# Patient Record
Sex: Female | Born: 1965 | Race: Black or African American | Hispanic: No | Marital: Single | Smoking: Never smoker
Health system: Southern US, Community
[De-identification: ages and names within clinical notes are randomized; demographics above are authoritative.]

## PROBLEM LIST (undated history)

## (undated) HISTORY — PX: OTHER SURGICAL HISTORY: SHX169

---

## 1999-06-06 ENCOUNTER — Emergency Department (HOSPITAL_COMMUNITY): Admission: EM | Admit: 1999-06-06 | Discharge: 1999-06-06 | Payer: Self-pay | Admitting: Emergency Medicine

## 1999-06-06 ENCOUNTER — Encounter: Payer: Self-pay | Admitting: Emergency Medicine

## 2003-03-24 ENCOUNTER — Other Ambulatory Visit: Admission: RE | Admit: 2003-03-24 | Discharge: 2003-03-24 | Payer: Self-pay | Admitting: Obstetrics and Gynecology

## 2004-06-09 ENCOUNTER — Encounter (INDEPENDENT_AMBULATORY_CARE_PROVIDER_SITE_OTHER): Payer: Self-pay | Admitting: *Deleted

## 2004-06-09 ENCOUNTER — Inpatient Hospital Stay (HOSPITAL_COMMUNITY): Admission: RE | Admit: 2004-06-09 | Discharge: 2004-06-12 | Payer: Self-pay | Admitting: Obstetrics and Gynecology

## 2007-08-05 ENCOUNTER — Other Ambulatory Visit: Admission: RE | Admit: 2007-08-05 | Discharge: 2007-08-05 | Payer: Self-pay | Admitting: Family Medicine

## 2010-07-15 ENCOUNTER — Encounter: Payer: Self-pay | Admitting: General Surgery

## 2010-11-10 NOTE — Op Note (Signed)
Erin Martin, HAUGHN            ACCOUNT NO.:  [id number]   MEDICAL RECORD NO.:  [id number]          PATIENT TYPE:  INP   LOCATION:  9146                          FACILITY:  [facility]   PHYSICIAN:  Lenoard Aden, M.D.DATE OF BIRTH:  06/10/1966   DATE OF PROCEDURE:  06/09/2004  DATE OF DISCHARGE:                                 OPERATIVE REPORT   PREOPERATIVE DIAGNOSES:  1.  Previous cesarean section x2, 39 weeks.  2.  Elective sterilization.   POSTOPERATIVE DIAGNOSES:  1.  Previous cesarean section x2, 39 weeks.  2.  Elective sterilization.   PROCEDURE:  Repeat low segment transverse cesarean section, left partial  salpingectomy.   SURGEON:  Lenoard Aden, M.D.   ASSISTANT:  Pershing Cox, M.D.   ANESTHESIA:  Spinal by Dr. Tacy Dura.   ESTIMATED BLOOD LOSS:  1000 mL.   COMPLICATIONS:  None.   DRAINS:  Foley.   COUNTS:  Correct.   The patient to recovery in good condition.   FINDINGS:  Full-term living female, occiput transverse position.  Apgars 8 and  9.  Anterior placenta.  Right tube surgically absent from previous ectopic  pregnancy.  Normal left tube.  Normal right tube and ovary.   BRIEF OPERATIVE NOTE:  After being apprised of the risks of anesthesia,  infection, bleeding, injury to abdominal organs with need for repair,  delayed versus immediate complications to include bowel and bladder injury,  possible failure risk of tubal ligation of 5-10 per 1000, the patient is  brought to the operating room where she is administered a spinal anesthetic  without complications and prepped and draped in the usual sterile fashion,  Foley catheter placed.  After achieving adequate anesthesia, dilute Marcaine  solution placed in the area of previous Pfannenstiel skin incision which was  made with the scalpel, carried down to the fascia which was nicked in the  midline, opened transversely using Mayo scissors.  The rectus muscles were  dissected sharply in the midline.   Peritoneum entered sharply with bladder  blade placed.  The uterus scored in a smiling fashion.  Atraumatic delivery  of a full-term living female with vacuum assistance, one pull, Apgars 8 and 9.  Pediatrician attends.  Cord blood collected.  Placenta delivered manually  from an anterior position intact.  Uterus secured using a dry lap pack and  closed in one layer using a 0 Monocryl suture.  Good hemostasis with  interrupted suture along the right lateral edge and in the midline with  another interrupted suture.  Right tube surgically absent. Left tube traced  out to the fimbriated end.  The ampullary isthmic portion of the left tube  is grasped, avascular portion of the mesosalpinx was cauterized creating a  window, and proximal distal plain ties are placed.  Tubal segment is excised  and sent to pathology for confirmation.  Good hemostasis noted.  Tubal  lumens are cauterized and visualized at this time.  Good hemostasis along  the incision is noted.  Bladder flap inspected and found to be hemostatic.  The uterus was replaced in the abdominal cavity.  Irrigation was  accomplished.  All blood clots are subsequently removed.  The fascia then  closed using a 0 Monocryl suture in a running fashion, skin closed using  staples.  The patient tolerated the procedure well, was transferred to  recovery in good condition.      RJT/MEDQ  D:  06/09/2004  T:  06/09/2004  Job:  [id number]

## 2010-11-10 NOTE — Discharge Summary (Signed)
Erin Martin, Erin Martin            ACCOUNT NO.:  [id number]   MEDICAL RECORD NO.:  [id number]          PATIENT TYPE:  OUT   LOCATION:  RAD                           FACILITY:  [facility]   PHYSICIAN:  Lenoard Aden, M.D.DATE OF BIRTH:  September 02, 1965   DATE OF ADMISSION:  DATE OF DISCHARGE:  06/12/2004                                 DISCHARGE SUMMARY   ADMISSION DIAGNOSIS:  Previous C-section x2 for repeat tubal ligation.   DISCHARGE DIAGNOSIS:  Same.   The patient underwent uncomplicated C-section and tubal ligation June 09, 2004.  Postoperative course uncomplicated.  Tolerated a regular diet  well.  Discharged home on day #3 with Tylox, prenatal vitamins given.  Follow up in the office in four to six weeks.     Rich   RJT/MEDQ  D:  07/11/2004  T:  07/11/2004  Job:  [id number]

## 2011-11-16 ENCOUNTER — Other Ambulatory Visit: Payer: Self-pay | Admitting: Obstetrics and Gynecology

## 2011-11-16 DIAGNOSIS — Z1231 Encounter for screening mammogram for malignant neoplasm of breast: Secondary | ICD-10-CM

## 2011-11-23 ENCOUNTER — Ambulatory Visit: Payer: Self-pay

## 2011-11-29 ENCOUNTER — Ambulatory Visit
Admission: RE | Admit: 2011-11-29 | Discharge: 2011-11-29 | Disposition: A | Discharge status: Home / Self Care | Payer: Self-pay | Source: Ambulatory Visit | Attending: Obstetrics and Gynecology | Admitting: Obstetrics and Gynecology

## 2011-11-29 DIAGNOSIS — Z1231 Encounter for screening mammogram for malignant neoplasm of breast: Secondary | ICD-10-CM

## 2013-06-25 IMAGING — MG MM DIGITAL SCREENING BILAT
6 series · 6 of 6 positions shown · non-contrast
Comparison: none

CLINICAL DATA: Screening.

MAMMOGRAPHIC BILATERAL DIGITAL SCREENING WITH CAD

[R CC]
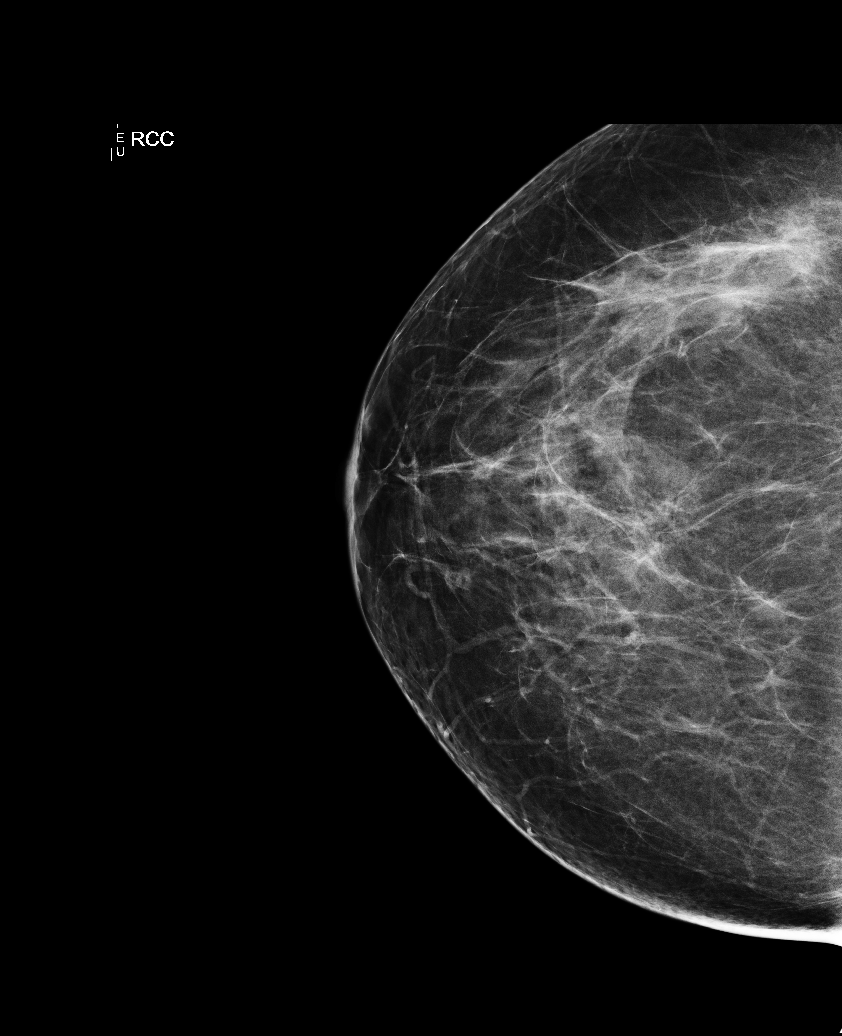

[L CC]
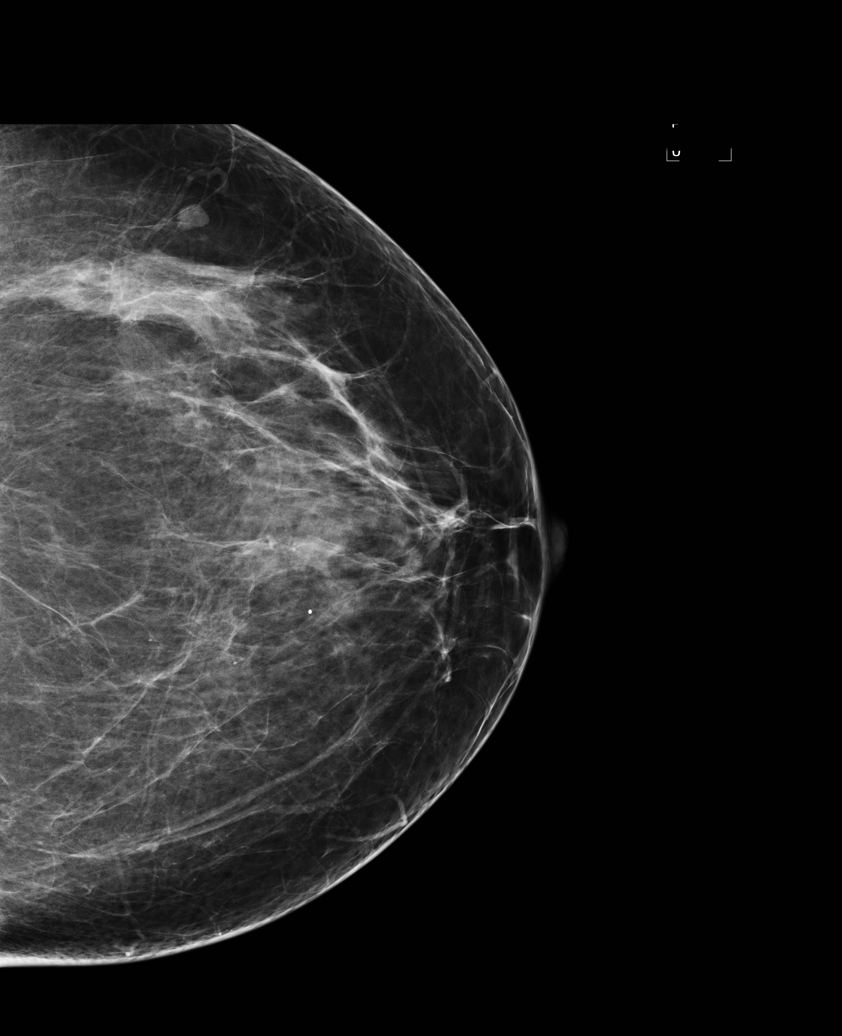

[L MLO (1 of 2)]
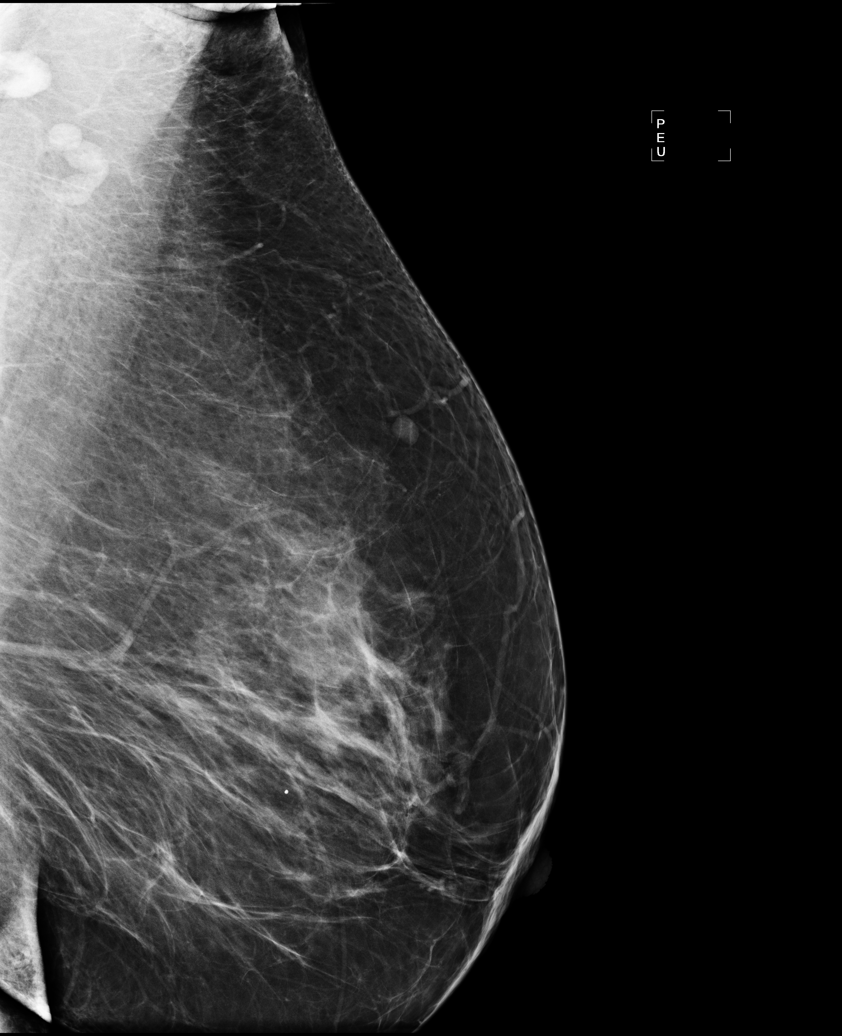

[R MLO (1 of 2)]
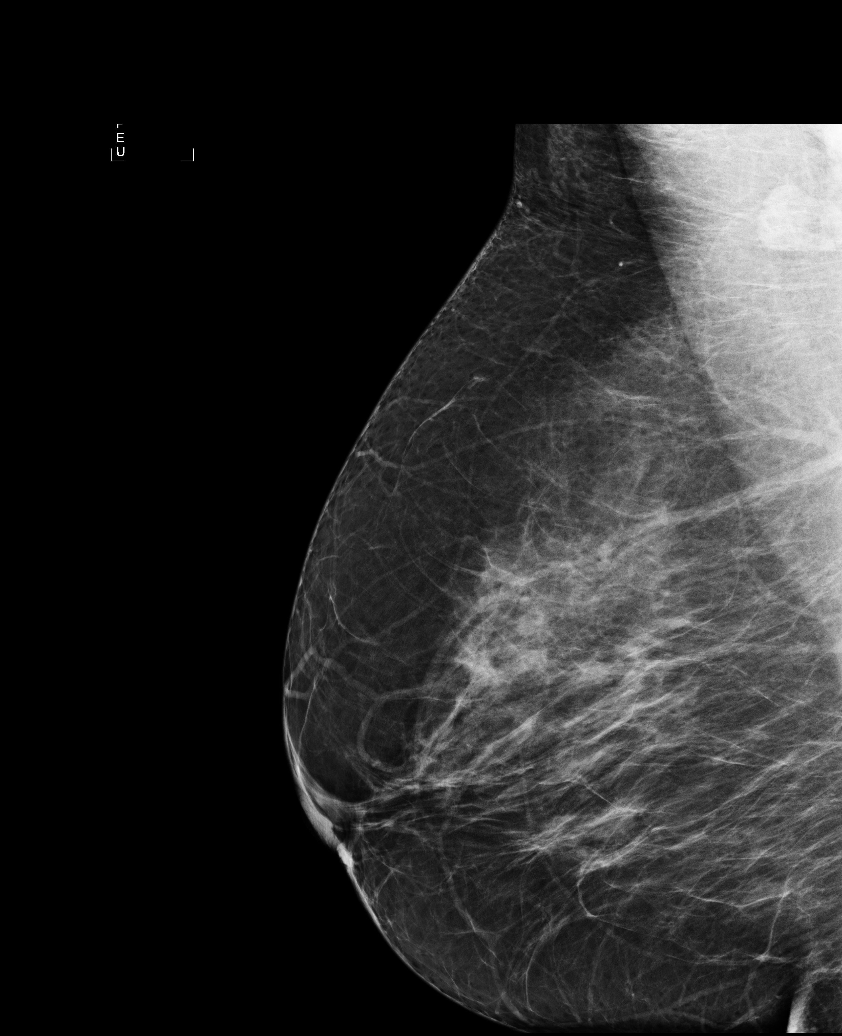

[R MLO (2 of 2)]
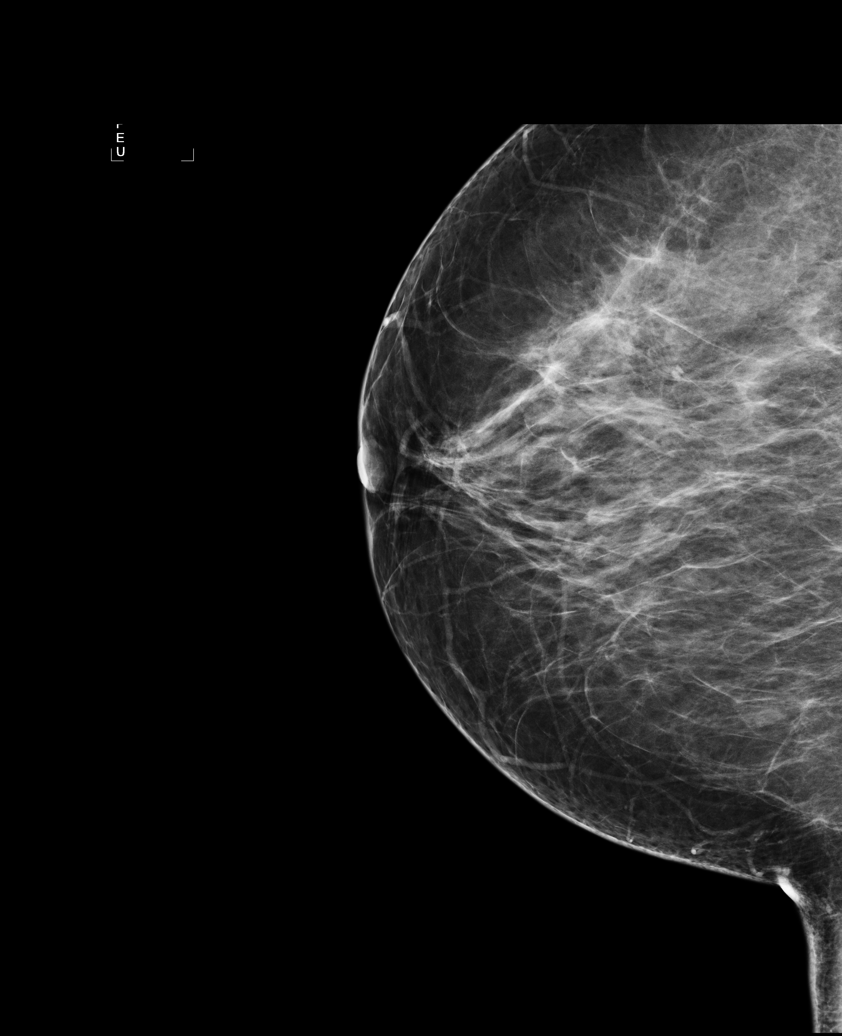

[L MLO (2 of 2)]
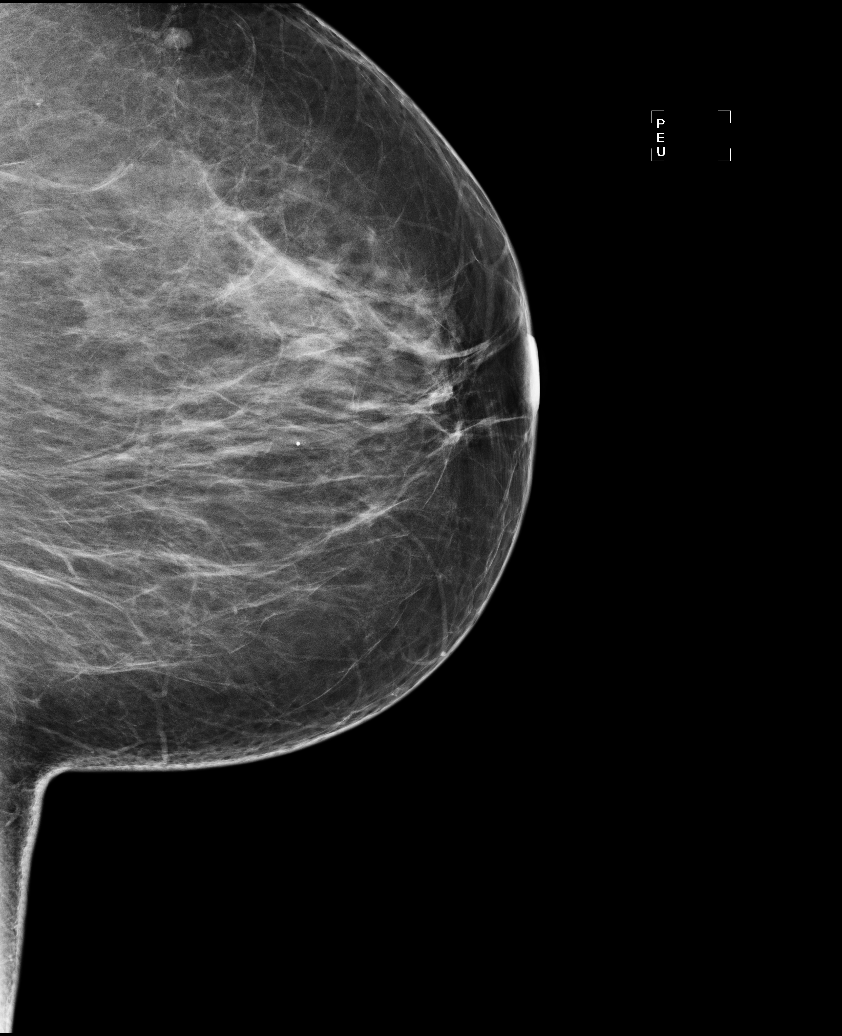

[6 of 6 positions shown; findings below may reference images not displayed]

FINDINGS: There are scattered fibroglandular densities.  No masses
or malignant type calcifications are identified.

Images were processed with CAD.
IMPRESSION: No specific mammographic evidence of malignancy.

A result letter of this screening mammogram will be mailed directly
to the patient.

RECOMMENDATION:
Screening mammogram in one year. (Code:[id number])

BI-RADS CATEGORY 1:  Negative

## 2015-02-28 ENCOUNTER — Emergency Department (HOSPITAL_COMMUNITY)
Admission: EM | Admit: 2015-02-28 | Discharge: 2015-02-28 | Discharge status: Absent without leave | Payer: Commercial Indemnity | Source: Home / Self Care

## 2015-02-28 ENCOUNTER — Emergency Department (HOSPITAL_COMMUNITY)
Admission: EM | Admit: 2015-02-28 | Discharge: 2015-02-28 | Disposition: A | Discharge status: Home / Self Care | Payer: Commercial Indemnity | Attending: Emergency Medicine | Admitting: Emergency Medicine

## 2015-02-28 ENCOUNTER — Encounter (HOSPITAL_COMMUNITY): Payer: Self-pay | Admitting: *Deleted

## 2015-02-28 DIAGNOSIS — R350 Frequency of micturition: Secondary | ICD-10-CM | POA: Insufficient documentation

## 2015-02-28 DIAGNOSIS — Z791 Long term (current) use of non-steroidal anti-inflammatories (NSAID): Secondary | ICD-10-CM | POA: Diagnosis not present

## 2015-02-28 DIAGNOSIS — R109 Unspecified abdominal pain: Secondary | ICD-10-CM | POA: Diagnosis present

## 2015-02-28 DIAGNOSIS — R1110 Vomiting, unspecified: Secondary | ICD-10-CM | POA: Insufficient documentation

## 2015-02-28 DIAGNOSIS — Z3202 Encounter for pregnancy test, result negative: Secondary | ICD-10-CM | POA: Insufficient documentation

## 2015-02-28 LAB — URINALYSIS, ROUTINE W REFLEX MICROSCOPIC
Bilirubin Urine: NEGATIVE
Glucose, UA: NEGATIVE mg/dL
Hgb urine dipstick: NEGATIVE
Ketones, ur: NEGATIVE mg/dL
Leukocytes, UA: NEGATIVE
Nitrite: NEGATIVE
Protein, ur: NEGATIVE mg/dL
Specific Gravity, Urine: 1.026 (ref 1.005–1.030)
Urobilinogen, UA: 2 mg/dL — ABNORMAL HIGH (ref 0.0–1.0)
pH: 7 (ref 5.0–8.0)

## 2015-02-28 LAB — POC URINE PREG, ED: Preg Test, Ur: NEGATIVE

## 2015-02-28 NOTE — Discharge Instructions (Signed)

## 2015-02-28 NOTE — ED Provider Notes (Signed)
CSN: [id number]     Arrival date & time 02/28/15  0105 History  This chart was scribed for Zadie Rhine, MD by Budd Palmer, ED Scribe. This patient was seen in room WA11/WA11 and the patient's care was started at 1:25 AM.    Chief Complaint  Patient presents with  . Flank Pain   Patient is a 49 y.o. female presenting with flank pain. The history is provided by the patient. No language interpreter was used.  Flank Pain This is a new problem. The current episode started more than 2 days ago. The problem has been gradually worsening. The symptoms are relieved by NSAIDs.   HPI Comments: Erin Martin is a 49 y.o. female who presents to the Emergency Department complaining of gradual onset, sharp, intermittent, worsening left-sided flank pain onset 3 days ago. She notes the pain has begun to radiate to the left abdomen as of today. She reports associated vomiting (1x) and frequency. She has taken Aleve with effective pain relief, but states the pain returned much worse each time the medicine wore off. She denies a PMHx of kidney stones. Pt denies fever, dysuria, weakness, numbness, and vaginal bleeding.  PMH - none Past Surgical History  Procedure Laterality Date  . C-section     No family history on file. Social History  Substance Use Topics  . Smoking status: Never Smoker   . Smokeless tobacco: None  . Alcohol Use: Yes     Comment: rarely   OB History    No data available     Review of Systems  Constitutional: Negative for fever.  Gastrointestinal: Positive for vomiting.  Genitourinary: Positive for frequency and flank pain. Negative for dysuria and vaginal bleeding.  Neurological: Negative for weakness and numbness.  All other systems reviewed and are negative.   Allergies  Review of patient's allergies indicates no known allergies.  Home Medications   Prior to Admission medications   Medication Sig Start Date End Date Taking? Authorizing Provider  naproxen  sodium (ANAPROX) 220 MG tablet Take 220 mg by mouth 2 (two) times daily with a meal.   Yes Historical Provider, MD   BP 149/84 mmHg  Pulse 85  Temp(Src) 98.1 F (36.7 C) (Oral)  Resp 20  SpO2 100%  LMP 02/14/2015 Physical Exam CONSTITUTIONAL: Well developed/well nourished HEAD: Normocephalic/atraumatic EYES: EOMI/PERRL ENMT: Mucous membranes moist NECK: supple no meningeal signs SPINE/BACK:entire spine nontender CV: S1/S2 noted, no murmurs/rubs/gallops noted LUNGS: Lungs are clear to auscultation bilaterally, no apparent distress ABDOMEN: soft, nontender, no rebound or guarding, bowel sounds noted throughout abdomen WU:JWJX cva tenderness NEURO: Pt is awake/alert/appropriate, moves all extremitiesx4.  No facial droop.   EXTREMITIES: pulses normal/equal, full ROM SKIN: warm, color normal PSYCH: no abnormalities of mood noted, alert and oriented to situation  ED Course  Procedures  DIAGNOSTIC STUDIES: Oxygen Saturation is 100% on RA, normal by my interpretation.    COORDINATION OF CARE: 1:29 AM - Discussed probable kidney stone. Discussed plans to order urinalysis. Pt advised of plan for treatment and pt agrees.   Pt declined pain meds She reports feeling improved Most of her pain was in left flank and resolving She would like to go home and defer further testing I feel this is reasonable.   Currently she has no signs of acute abdominal emergency We discussed strict ER return precautions  Labs Review Labs Reviewed  URINALYSIS, ROUTINE W REFLEX MICROSCOPIC (NOT AT [facility]) - Abnormal; Notable for the following:    APPearance CLOUDY (*)  Urobilinogen, UA 2.0 (*)    All other components within normal limits  POC URINE PREG, ED      MDM   Final diagnoses:  Left flank pain    Nursing notes including past medical history and social history reviewed and considered in documentation Labs/vital reviewed myself and considered during evaluation    I personally  performed the services described in this documentation, which was scribed in my presence. The recorded information has been reviewed and is accurate.      Zadie Rhine, MD 02/28/15 [phone]

## 2015-02-28 NOTE — ED Notes (Signed)
Pt states that she was having left sided flank pain that began on Fri; pt states that the pain is moving through her side toward her lower abd; pt reports vomiting x 1; pt reports urinary frequency and urgency

## 2019-10-14 ENCOUNTER — Ambulatory Visit: Payer: Managed Care, Other (non HMO) | Admitting: Family Medicine

## 2019-10-14 DIAGNOSIS — Z0289 Encounter for other administrative examinations: Secondary | ICD-10-CM

## 2019-10-14 NOTE — Progress Notes (Deleted)
Erin Martin is a 54 y.o. female  No chief complaint on file.   HPI: Erin Martin is a 54 y.o. female here as a new patient to establish care and for annual CPE, PAP.  Last PAP: *** Last mammo: *** Last colonoscopy: ***  Diet/Exercise: ***  Med refills needed today? ***  Last menstrual cycle: ***  No past medical history on file.  Past Surgical History:  Procedure Laterality Date  . c-section      Social History   Socioeconomic History  . Marital status: Single    Spouse name: Not on file  . Number of children: Not on file  . Years of education: Not on file  . Highest education level: Not on file  Occupational History  . Not on file  Tobacco Use  . Smoking status: Never Smoker  Substance and Sexual Activity  . Alcohol use: Yes    Comment: rarely  . Drug use: No  . Sexual activity: Not on file  Other Topics Concern  . Not on file  Social History Narrative  . Not on file   Social Determinants of Health   Financial Resource Strain:   . Difficulty of Paying Living Expenses:   Food Insecurity:   . Worried About Charity fundraiser in the Last Year:   . Arboriculturist in the Last Year:   Transportation Needs:   . Film/video editor (Medical):   Marland Kitchen Lack of Transportation (Non-Medical):   Physical Activity:   . Days of Exercise per Week:   . Minutes of Exercise per Session:   Stress:   . Feeling of Stress :   Social Connections:   . Frequency of Communication with Friends and Family:   . Frequency of Social Gatherings with Friends and Family:   . Attends Religious Services:   . Active Member of Clubs or Organizations:   . Attends Archivist Meetings:   Marland Kitchen Marital Status:   Intimate Partner Violence:   . Fear of Current or Ex-Partner:   . Emotionally Abused:   Marland Kitchen Physically Abused:   . Sexually Abused:     No family history on file.    There is no immunization history on file for this patient.  Outpatient Encounter  Medications as of 10/14/2019  Medication Sig  . naproxen sodium (ANAPROX) 220 MG tablet Take 220 mg by mouth 2 (two) times daily with a meal.   No facility-administered encounter medications on file as of 10/14/2019.     ROS: Gen: no fever, chills  Skin: no rash, itching ENT: no ear pain, ear drainage, nasal congestion, rhinorrhea, sinus pressure, sore throat Eyes: no blurry vision, double vision Resp: no cough, wheeze,SOB Breast: no breast tenderness, no nipple discharge, no breast masses CV: no CP, palpitations, LE edema,  GI: no heartburn, n/v/d/c, abd pain GU: no dysuria, urgency, frequency, hematuria; no vaginal itching, odor, discharge MSK: no joint pain, myalgias, back pain Neuro: no dizziness, headache, weakness, vertigo Psych: no depression, anxiety, insomnia   No Known Allergies  There were no vitals taken for this visit.  Physical Exam  Constitutional: She is oriented to person, place, and time. She appears well-developed and well-nourished. No distress.  HENT:  Head: Normocephalic and atraumatic.  Right Ear: Tympanic membrane and ear canal normal.  Left Ear: Tympanic membrane and ear canal normal.  Nose: Nose normal.  Mouth/Throat: Oropharynx is clear and moist and mucous membranes are normal.  Eyes: Pupils are equal,  round, and reactive to light. Conjunctivae are normal.  Neck: No thyromegaly present.  Cardiovascular: Normal rate, regular rhythm, normal heart sounds and intact distal pulses.  No murmur heard. Pulmonary/Chest: Effort normal and breath sounds normal. No respiratory distress. She has no wheezes. She has no rhonchi.  Abdominal: Soft. Bowel sounds are normal. She exhibits no distension and no mass. There is no abdominal tenderness.  Genitourinary:    Vagina and uterus normal.  There is no rash, tenderness or lesion on the right labia. There is no rash, tenderness or lesion on the left labia. Cervix exhibits no motion tenderness, no discharge and no  friability. Right adnexum displays no mass, no tenderness and no fullness. Left adnexum displays no mass, no tenderness and no fullness.  Musculoskeletal:        General: No edema.     Cervical back: Neck supple.  Lymphadenopathy:    She has no cervical adenopathy.  Neurological: She is alert and oriented to person, place, and time. She exhibits normal muscle tone. Coordination normal.  Skin: Skin is warm and dry.  Psychiatric: She has a normal mood and affect. Her behavior is normal.     A/P:    This visit occurred during the SARS-CoV-2 public health emergency.  Safety protocols were in place, including screening questions prior to the visit, additional usage of staff PPE, and extensive cleaning of exam room while observing appropriate contact time as indicated for disinfecting solutions.

## 2022-08-08 ENCOUNTER — Encounter: Payer: Self-pay | Admitting: Family Medicine

## 2022-08-08 ENCOUNTER — Ambulatory Visit: Payer: Managed Care, Other (non HMO) | Admitting: Family Medicine

## 2022-08-08 ENCOUNTER — Other Ambulatory Visit (HOSPITAL_COMMUNITY)
Admission: RE | Admit: 2022-08-08 | Discharge: 2022-08-08 | Disposition: A | Discharge status: Home / Self Care | Payer: Managed Care, Other (non HMO) | Source: Ambulatory Visit | Attending: Family Medicine | Admitting: Family Medicine

## 2022-08-08 DIAGNOSIS — Z1329 Encounter for screening for other suspected endocrine disorder: Secondary | ICD-10-CM

## 2022-08-08 DIAGNOSIS — Z1211 Encounter for screening for malignant neoplasm of colon: Secondary | ICD-10-CM | POA: Insufficient documentation

## 2022-08-08 DIAGNOSIS — E7800 Pure hypercholesterolemia, unspecified: Secondary | ICD-10-CM | POA: Diagnosis not present

## 2022-08-08 DIAGNOSIS — Z113 Encounter for screening for infections with a predominantly sexual mode of transmission: Secondary | ICD-10-CM | POA: Insufficient documentation

## 2022-08-08 DIAGNOSIS — Z131 Encounter for screening for diabetes mellitus: Secondary | ICD-10-CM

## 2022-08-08 DIAGNOSIS — Z7409 Other reduced mobility: Secondary | ICD-10-CM

## 2022-08-08 DIAGNOSIS — R030 Elevated blood-pressure reading, without diagnosis of hypertension: Secondary | ICD-10-CM

## 2022-08-08 DIAGNOSIS — Z1159 Encounter for screening for other viral diseases: Secondary | ICD-10-CM | POA: Insufficient documentation

## 2022-08-08 DIAGNOSIS — T2005XA Burn of unspecified degree of scalp [any part], initial encounter: Secondary | ICD-10-CM | POA: Insufficient documentation

## 2022-08-08 DIAGNOSIS — Z1321 Encounter for screening for nutritional disorder: Secondary | ICD-10-CM | POA: Insufficient documentation

## 2022-08-08 DIAGNOSIS — T2005XS Burn of unspecified degree of scalp [any part], sequela: Secondary | ICD-10-CM

## 2022-08-08 DIAGNOSIS — Z114 Encounter for screening for human immunodeficiency virus [HIV]: Secondary | ICD-10-CM | POA: Insufficient documentation

## 2022-08-08 DIAGNOSIS — D508 Other iron deficiency anemias: Secondary | ICD-10-CM | POA: Insufficient documentation

## 2022-08-08 MED ORDER — SILVER SULFADIAZINE 1 % EX CREA: 1.0000 | TOPICAL_CREAM | Freq: Every day | CUTANEOUS | 0 refills | Status: DC

## 2022-08-08 MED ORDER — TIRZEPATIDE-WEIGHT MANAGEMENT 2.5 MG/0.5ML ~~LOC~~ SOAJ: 2.5000 mg | SUBCUTANEOUS | 0 refills | Status: DC

## 2022-08-08 NOTE — Assessment & Plan Note (Signed)
Denies current symptoms- request for STI screening Is sexually active with female partner(s)
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
## 2022-08-08 NOTE — Assessment & Plan Note (Signed)
Concern for weight impairing exercise and comfort when sitting and standing as well as walking

## 2022-08-08 NOTE — Assessment & Plan Note (Signed)
Recommend Vit B12 and Vit D given concern for stressors and weight mgmt  Body mass index is 44.59 kg/m.

## 2022-08-08 NOTE — Assessment & Plan Note (Signed)
Agreeable to colo guard; notes that she will have difficulty swallowing amount of solution needed for traditional

## 2022-08-08 NOTE — Assessment & Plan Note (Signed)
Low risk screen Treatable, and curable. If left untreated Hep C can lead to cirrhosis and liver failure. Encourage routine testing; recommend repeat testing if risk factors change.  

## 2022-08-08 NOTE — Assessment & Plan Note (Signed)
Recommend A1c given obesity, borderline BP and hx of LDL elevation

## 2022-08-08 NOTE — Assessment & Plan Note (Signed)
Hx of IDA- recommend repeat CBC and iron panel

## 2022-08-08 NOTE — Assessment & Plan Note (Signed)
Recommend screening for thyroid given borderline BP; weight Body mass index is 44.59 kg/m.

## 2022-08-08 NOTE — Assessment & Plan Note (Signed)
Historically elevated Repeat NFLP recommend diet low in saturated fat and regular exercise - 30 min at least 5 times per week
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
## 2022-08-08 NOTE — Assessment & Plan Note (Signed)
Acute, stable Endorses headaches- however, has been under stress at home and at work. Also, does not eat regular meals or drink sufficient water. Goal <140/<90 without co morbid conditions

## 2022-08-08 NOTE — Assessment & Plan Note (Signed)
Low risk screen ?Consented; encouraged to "know your status" ?Recommend repeat screen if risk factors change ? ?

## 2022-08-08 NOTE — Assessment & Plan Note (Signed)
Chronic, worsening per pt report Body mass index is 44.59 kg/m. Continue to recommend balanced, lower carb meals. Smaller meal size, adding snacks. Choosing water as drink of choice and increasing purposeful exercise.

## 2022-08-09 ENCOUNTER — Telehealth: Payer: Self-pay

## 2022-08-09 ENCOUNTER — Other Ambulatory Visit: Payer: Self-pay | Admitting: Family Medicine

## 2022-08-09 LAB — CBC WITH DIFFERENTIAL/PLATELET
Basos: 1 %
Immature Grans (Abs): 0 10*3/uL (ref 0.0–0.1)
Lymphocytes Absolute: 1.9 10*3/uL (ref 0.7–3.1)
MCHC: 31.8 g/dL (ref 31.5–35.7)
MCV: 87 fL (ref 79–97)
Neutrophils Absolute: 2.2 10*3/uL (ref 1.4–7.0)
Neutrophils: 47 %
Platelets: 214 10*3/uL (ref 150–450)
RBC: 4.25 x10E6/uL (ref 3.77–5.28)
WBC: 4.8 10*3/uL (ref 3.4–10.8)

## 2022-08-09 LAB — COMPREHENSIVE METABOLIC PANEL

## 2022-08-09 LAB — IRON,TIBC AND FERRITIN PANEL

## 2022-08-09 LAB — LIPID PANEL

## 2022-08-09 LAB — HEPATITIS C ANTIBODY: Hep C Virus Ab: NONREACTIVE

## 2022-08-09 LAB — HIV ANTIBODY (ROUTINE TESTING W REFLEX): HIV Screen 4th Generation wRfx: NONREACTIVE

## 2022-08-09 LAB — B12 AND FOLATE PANEL: Vitamin B-12: 597 pg/mL (ref 232–1245)

## 2022-08-09 MED ORDER — VITAMIN D (ERGOCALCIFEROL) 1.25 MG (50000 UNIT) PO CAPS: 50000.0000 [IU] | ORAL_CAPSULE | ORAL | 0 refills | Status: DC

## 2022-08-09 NOTE — Progress Notes (Signed)
It was nice to meet you yesterday Labs have been reviewed -Vit D is low. Recommend weekly supplement to assist. -Normal B12 and folate. -Negative HIV, Hep C, Syphilis  Other labs are pending.Marland KitchenMarland Kitchen

## 2022-08-09 NOTE — Telephone Encounter (Signed)
Copied from [place]. Topic: General - Other >> Aug 09, 2022 11:05 AM Sabas Sous wrote: Reason for CRM: Cytology called to report that a urine specimen has been submitted for HSV and HPV and they cannot use urine for those labs.  Janine  Best contact: [phone]

## 2022-08-10 LAB — CERVICOVAGINAL ANCILLARY ONLY
Bacterial Vaginitis-Urine: NEGATIVE
Candida Urine: NEGATIVE — AB
Candida Urine: POSITIVE — AB
Chlamydia: NEGATIVE
Comment: NEGATIVE
Comment: NEGATIVE
Comment: NORMAL
Neisseria Gonorrhea: NEGATIVE
Trichomonas: NEGATIVE

## 2022-08-10 NOTE — Progress Notes (Signed)
Normal STI screening; yeast noted in urine. Not treated given recommendations from UpToDate unless pt is symptomatic.

## 2022-08-11 LAB — CBC WITH DIFFERENTIAL/PLATELET
Basophils Absolute: 0 10*3/uL (ref 0.0–0.2)
EOS (ABSOLUTE): 0.2 10*3/uL (ref 0.0–0.4)
Eos: 5 %
Hematocrit: 37.1 % (ref 34.0–46.6)
Hemoglobin: 11.8 g/dL (ref 11.1–15.9)
Immature Granulocytes: 0 %
Lymphs: 40 %
MCH: 27.8 pg (ref 26.6–33.0)
Monocytes Absolute: 0.3 10*3/uL (ref 0.1–0.9)
Monocytes: 7 %
RDW: 11.9 % (ref 11.7–15.4)

## 2022-08-11 LAB — COMPREHENSIVE METABOLIC PANEL
Albumin/Globulin Ratio: 1 — ABNORMAL LOW (ref 1.2–2.2)
BUN/Creatinine Ratio: 22 (ref 9–23)
BUN: 15 mg/dL (ref 6–24)
Bilirubin Total: <0.2 mg/dL (ref 0.0–1.2)
CO2: 19 mmol/L — ABNORMAL LOW (ref 20–29)
Calcium: 9.4 mg/dL (ref 8.7–10.2)
Creatinine, Ser: 0.68 mg/dL (ref 0.57–1.00)
Globulin, Total: 4 g/dL (ref 1.5–4.5)
Glucose: 93 mg/dL (ref 70–99)

## 2022-08-11 LAB — B12 AND FOLATE PANEL: Folate: 13.7 ng/mL (ref >3.0)

## 2022-08-11 LAB — VITAMIN D 25 HYDROXY (VIT D DEFICIENCY, FRACTURES): Vit D, 25-Hydroxy: 6.4 ng/mL — ABNORMAL LOW (ref 30.0–100.0)

## 2022-08-11 LAB — HEMOGLOBIN A1C
Est. average glucose Bld gHb Est-mCnc: 103 mg/dL
Hgb A1c MFr Bld: 5.2 % (ref 4.8–5.6)

## 2022-08-11 LAB — LIPID PANEL: Chol/HDL Ratio: 4.3 ratio (ref 0.0–4.4)

## 2022-08-11 LAB — RPR: RPR Ser Ql: NONREACTIVE

## 2022-08-11 LAB — IRON,TIBC AND FERRITIN PANEL: Total Iron Binding Capacity: 374 ug/dL (ref 250–450)

## 2022-08-11 LAB — TSH+FREE T4
Free T4: 0.95 ng/dL (ref 0.82–1.77)
TSH: 0.91 u[IU]/mL (ref 0.450–4.500)

## 2022-08-12 NOTE — Progress Notes (Signed)
Chemistry shows borderline sodium elevation; likely elevated d/t dehydration. Cholesterol is elevated in total and bad/LDL. The 10-year ASCVD risk score (Arnett DK, et al., 2019) is: 5.1%. This is borderline- would recommend statin medication to assist. Please let us know if you are interested.    Values used to calculate the score:     Age: 57 years     Sex: Female     Is Non-Hispanic African American: Yes     Diabetic: No     Tobacco smoker: No     Systolic Blood Pressure: [id number] mmHg     Is BP treated: No     HDL Cholesterol: 59 mg/dL     Total Cholesterol: 252 mg/dL  I continue to recommend diet low in saturated fat and regular exercise - 30 min at least 5 times per week  Vit D is very low. Recommend 5000 IU OTC to assist vs weekly Rx supplement.  Normal cell count. No pre-diabetes. Normal thyroid. Normal iron.

## 2022-08-15 ENCOUNTER — Ambulatory Visit: Payer: Managed Care, Other (non HMO) | Admitting: Family Medicine

## 2022-08-28 ENCOUNTER — Other Ambulatory Visit (HOSPITAL_COMMUNITY)
Admission: RE | Admit: 2022-08-28 | Discharge: 2022-08-28 | Disposition: A | Discharge status: Home / Self Care | Payer: Managed Care, Other (non HMO) | Source: Ambulatory Visit | Attending: Family Medicine | Admitting: Family Medicine

## 2022-08-28 ENCOUNTER — Encounter: Payer: Self-pay | Admitting: Family Medicine

## 2022-08-28 ENCOUNTER — Ambulatory Visit: Payer: Managed Care, Other (non HMO) | Admitting: Family Medicine

## 2022-08-28 DIAGNOSIS — I10 Essential (primary) hypertension: Secondary | ICD-10-CM

## 2022-08-28 DIAGNOSIS — Z1231 Encounter for screening mammogram for malignant neoplasm of breast: Secondary | ICD-10-CM | POA: Diagnosis not present

## 2022-08-28 DIAGNOSIS — Z124 Encounter for screening for malignant neoplasm of cervix: Secondary | ICD-10-CM | POA: Diagnosis present

## 2022-08-28 MED ORDER — BUPROPION HCL ER (XL) 150 MG PO TB24: 150.0000 mg | ORAL_TABLET | Freq: Every day | ORAL | 1 refills | Status: DC

## 2022-08-28 MED ORDER — AMLODIPINE BESYLATE 5 MG PO TABS: 5.0000 mg | ORAL_TABLET | Freq: Every day | ORAL | 1 refills | Status: AC

## 2022-08-28 MED ORDER — NALTREXONE HCL 50 MG PO TABS: 25.0000 mg | ORAL_TABLET | Freq: Every day | ORAL | 0 refills | Status: DC

## 2022-08-28 NOTE — Assessment & Plan Note (Signed)
Start of Norvasc 5 mg daily Return in 4-6 weeks Goal <140/<90

## 2022-08-28 NOTE — Assessment & Plan Note (Signed)
Chronic, stable Body mass index is 44.59 kg/m. Start naltrexone and wellbutrin Continue to recommend balanced, lower carb meals. Smaller meal size, adding snacks. Choosing water as drink of choice and increasing purposeful exercise.

## 2022-08-28 NOTE — Assessment & Plan Note (Signed)
Negative PAP history

## 2022-08-28 NOTE — Assessment & Plan Note (Signed)
Due for screening for mammogram, denies breast concerns, provided with phone number to call and schedule appointment for mammogram. Encouraged to repeat breast cancer screening every 1-2 years.  Please call and schedule your mammogram:  [facility] at [facility]  [place], [place],  [place]  [address] Get Driving Directions Main: [phone]  Sunday:Closed Monday:7:20 AM - 5:00 PM Tuesday:7:20 AM - 5:00 PM Wednesday:7:20 AM - 5:00 PM Thursday:7:20 AM - 5:00 PM Friday:7:20 AM - 4:30 PM Saturday:Closed

## 2022-08-28 NOTE — Patient Instructions (Signed)
The CDC recommends two doses of Shingrix (the new shingles vaccine) separated by 2 to 6 months for adults age 56 years and older. I recommend checking with your insurance plan regarding coverage for this vaccine.    

## 2022-08-28 NOTE — Progress Notes (Signed)
.   Established patient visit  Patient: Erin Martin   DOB: 11-Jun-1966   57 y.o. Female  MRN: [id number] Visit Date: 08/28/2022  Today's healthcare provider: [name], FNP  Re Introduced to nurse practitioner role and practice setting.  All questions answered.  Discussed provider/patient relationship and expectations.  Subjective    HPI HPI   Lab results- PAP smear- obesity mgmt Last edited by Elta Guadeloupe, CMA on 08/28/2022  9:38 AM.      Obesity: Patient complains of obesity. Patient cites self-image as reasons for wanting to lose weight.  Current Exercise Habits none  Current Eating Habits Number of regular meals per day: 1 Number of snacking episodes per day: 4 Who shops for food? patient and daughter Who prepares food? patient and daughter Who eats with patient? patient and friend Binge behavior?: yes - excess quantity, chips, candy, chocolate; often goes out Purge behavior? no Anorexic behavior? no Eating precipitated by stress? no Guilt feelings associated with eating? yes - a little bit  Other Potential Contributing Factors Use of alcohol: none Use of medications that may cause weight gain none History of past abuse? none Psych History:  none Comorbidities: hypertension  Hypertension: Patient is here for evaluation of elevated blood pressures.  Age at onset of elevated blood pressure:  50s.Cardiac symptoms dyspnea and near-syncope. Patient denies chest pain, chest pressure/discomfort, claudication, exertional chest pressure/discomfort, fatigue, irregular heart beat, lower extremity edema, near-syncope, orthopnea, palpitations, paroxysmal nocturnal dyspnea, syncope, and tachypnea.  Cardiovascular risk factors: obesity (BMI >= 30 kg/m2) and sedentary lifestyle. Use of agents associated with hypertension: none. History of target organ damage: none.  Medications:  Outpatient Medications Prior to Visit  Medication Sig   naproxen sodium (ANAPROX) 220 MG tablet  Take 220 mg by mouth 2 (two) times daily with a meal.   silver sulfADIAZINE (SILVADENE) 1 % cream Apply 1 Application topically daily.   tirzepatide (ZEPBOUND) 2.5 MG/0.5ML Pen Inject 2.5 mg into the skin once a week.   Vitamin D, Ergocalciferol, (DRISDOL) 1.25 MG (50000 UNIT) CAPS capsule Take 1 capsule (50,000 Units total) by mouth every 7 (seven) days.   No facility-administered medications prior to visit.   Review of Systems    Objective    BP (!) 148/86 (BP Location: Right Arm, Patient Position: Sitting, Cuff Size: Large)   Pulse 70   Temp 98.9 F (37.2 C)   Ht '5\' 1"'$  (1.549 m)   Wt 236 lb (107 kg)   LMP 03/26/2022   SpO2 98%   BMI 44.59 kg/m   Physical Exam Vitals and nursing note reviewed.  Constitutional:      General: She is not in acute distress.    Appearance: Normal appearance. She is obese. She is not ill-appearing, toxic-appearing or diaphoretic.  HENT:     Head: Normocephalic and atraumatic.  Cardiovascular:     Rate and Rhythm: Normal rate and regular rhythm.     Pulses: Normal pulses.     Heart sounds: Normal heart sounds. No murmur heard.    No friction rub. No gallop.  Pulmonary:     Effort: Pulmonary effort is normal. No respiratory distress.     Breath sounds: Normal breath sounds. No stridor. No wheezing, rhonchi or rales.  Chest:     Chest wall: No tenderness.     Comments: Breasts: breasts appear normal, no suspicious masses, no skin or nipple changes or axillary nodes, right breast normal without mass, skin or nipple changes or axillary nodes,  left breast normal without mass, skin or nipple changes or axillary nodes, risk and benefit of breast self-exam was discussed  Abdominal:     General: Bowel sounds are normal.     Palpations: Abdomen is soft.  Genitourinary:    General: Normal vulva.     Exam position: Lithotomy position.     Tanner stage (genital): 5.     Vagina: Vaginal discharge present.     Cervix: Normal.     Uterus: Normal.       Adnexa: Right adnexa normal and left adnexa normal.  Musculoskeletal:        General: No swelling, tenderness, deformity or signs of injury. Normal range of motion.     Right lower leg: No edema.     Left lower leg: No edema.  Skin:    General: Skin is warm and dry.     Capillary Refill: Capillary refill takes less than 2 seconds.     Coloration: Skin is not jaundiced or pale.     Findings: No bruising, erythema, lesion or rash.  Neurological:     General: No focal deficit present.     Mental Status: She is alert and oriented to person, place, and time. Mental status is at baseline.     Cranial Nerves: No cranial nerve deficit.     Sensory: No sensory deficit.     Motor: No weakness.     Coordination: Coordination normal.  Psychiatric:        Mood and Affect: Mood normal.        Behavior: Behavior normal.        Thought Content: Thought content normal.        Judgment: Judgment normal.      No results found for any visits on 08/28/22.  Assessment & Plan     Problem List Items Addressed This Visit       Cardiovascular and Mediastinum   Primary hypertension    Start of Norvasc 5 mg daily Return in 4-6 weeks Goal <140/<90      Relevant Medications   amLODipine (NORVASC) 5 MG tablet     Other   Encounter for screening mammogram for malignant neoplasm of breast    Due for screening for mammogram, denies breast concerns, provided with phone number to call and schedule appointment for mammogram. Encouraged to repeat breast cancer screening every 1-2 years.  Please call and schedule your mammogram:  [facility] at [facility]  [place], [place],  [place]  [address] Get Driving Directions Main: [phone]  Sunday:Closed Monday:7:20 AM - 5:00 PM Tuesday:7:20 AM - 5:00 PM Wednesday:7:20 AM - 5:00 PM Thursday:7:20 AM - 5:00 PM Friday:7:20 AM - 4:30 PM Saturday:Closed       Relevant Orders   MM 3D  SCREENING MAMMOGRAM BILATERAL BREAST   Morbid obesity ([place]) - Primary    Chronic, stable Body mass index is 44.59 kg/m. Start naltrexone and wellbutrin Continue to recommend balanced, lower carb meals. Smaller meal size, adding snacks. Choosing water as drink of choice and increasing purposeful exercise.       Relevant Medications   buPROPion (WELLBUTRIN XL) 150 MG 24 hr tablet   naltrexone (DEPADE) 50 MG tablet   Screening for cervical cancer    Negative PAP history      Relevant Orders   Cytology - PAP   Return in about 4 weeks (around 09/25/2022) for HTN management, blood pressure check 4-6 wks.  Vonna Kotyk, FNP, have reviewed all documentation for this visit. The documentation on 08/28/22 for the exam, diagnosis, procedures, and orders are all accurate and complete.  [name], [place] [phone] (phone) [phone] (fax)  [place]

## 2022-09-04 ENCOUNTER — Encounter: Payer: Self-pay | Admitting: Family Medicine

## 2022-09-04 LAB — CYTOLOGY - PAP
Adequacy: ABSENT
Chlamydia: NEGATIVE
Comment: NEGATIVE
Comment: NEGATIVE
Comment: NEGATIVE
Comment: NEGATIVE
Comment: NEGATIVE
Comment: NEGATIVE
Comment: NORMAL
Diagnosis: NEGATIVE
Diagnosis: REACTIVE
HPV 16: NEGATIVE
HPV 18 / 45: NEGATIVE
HSV1: NEGATIVE
HSV2: NEGATIVE
High risk HPV: POSITIVE — AB
Neisseria Gonorrhea: NEGATIVE
Trichomonas: NEGATIVE

## 2022-09-06 ENCOUNTER — Other Ambulatory Visit: Payer: Self-pay | Admitting: Family Medicine

## 2022-09-07 MED ORDER — TIRZEPATIDE-WEIGHT MANAGEMENT 2.5 MG/0.5ML ~~LOC~~ SOAJ: 2.5000 mg | SUBCUTANEOUS | 0 refills | Status: DC

## 2022-09-10 ENCOUNTER — Encounter: Payer: Self-pay | Admitting: Family Medicine

## 2022-09-11 ENCOUNTER — Other Ambulatory Visit: Payer: Self-pay | Admitting: Family Medicine

## 2022-09-11 MED ORDER — SEMAGLUTIDE-WEIGHT MANAGEMENT 0.5 MG/0.5ML ~~LOC~~ SOAJ: 0.5000 mg | SUBCUTANEOUS | 0 refills | Status: DC

## 2022-09-18 ENCOUNTER — Other Ambulatory Visit: Payer: Self-pay | Admitting: Family Medicine

## 2022-09-25 ENCOUNTER — Ambulatory Visit: Payer: Managed Care, Other (non HMO) | Admitting: Family Medicine

## 2022-09-26 NOTE — Progress Notes (Unsigned)
I,Joseline E Rosas,acting as a scribe for Erin Sprout, FNP.,have documented all relevant documentation on the behalf of Erin Sprout, FNP,as directed by  Erin Sprout, FNP while in the presence of Erin Sprout, FNP.   Established patient visit  Patient: Erin Martin   DOB: 03-Apr-1966   57 y.o. Female  MRN: [id number] Visit Date: 09/27/2022  Today's healthcare provider: [name], FNP  Re Introduced to nurse practitioner role and practice setting.  All questions answered.  Discussed provider/patient relationship and expectations.  Chief Complaint  Patient presents with   Follow-Up Hypertension   Subjective    HPI  Hypertension, follow-up  BP Readings from Last 3 Encounters:  09/27/22 136/70  08/28/22 (!) 148/86  08/08/22 (!) 140/76   Wt Readings from Last 3 Encounters:  09/27/22 236 lb 4.8 oz (107.2 kg)  08/28/22 236 lb (107 kg)  08/08/22 236 lb (107 kg)     She was last seen for hypertension 4 weeks ago.  BP at that visit was 148/86. Management since that visit includes start of Norvasc 5 mg daily .  She reports good compliance with treatment. She having some headaches and dizziness  Outside blood pressures are not being checked at home Symptoms: No chest pain No chest pressure  No palpitations No syncope  No dyspnea No orthopnea  No paroxysmal nocturnal dyspnea No lower extremity edema   Pertinent labs Lab Results  Component Value Date   CHOL 252 (H) 08/08/2022   HDL 59 08/08/2022   LDLCALC 172 (H) 08/08/2022   TRIG 120 08/08/2022   CHOLHDL 4.3 08/08/2022   Lab Results  Component Value Date   NA 145 (H) 08/08/2022   K 3.8 08/08/2022   CREATININE 0.68 08/08/2022   EGFR 103 08/08/2022   GLUCOSE 93 08/08/2022   TSH 0.910 08/08/2022     The 10-year ASCVD risk score (Arnett DK, et al., 2019) is: 7% ---------------------------------------------------------------------------------------------------  Medications: Outpatient Medications Prior  to Visit  Medication Sig   amLODipine (NORVASC) 5 MG tablet Take 1 tablet (5 mg total) by mouth daily.   buPROPion (WELLBUTRIN XL) 150 MG 24 hr tablet Take 1 tablet (150 mg total) by mouth daily.   naltrexone (DEPADE) 50 MG tablet Take 0.5 tablets (25 mg total) by mouth daily.   naproxen sodium (ANAPROX) 220 MG tablet Take 220 mg by mouth 2 (two) times daily with a meal.   Semaglutide-Weight Management 0.5 MG/0.5ML SOAJ Inject 0.5 mg into the skin once a week.   silver sulfADIAZINE (SILVADENE) 1 % cream Apply 1 Application topically daily.   Vitamin D, Ergocalciferol, (DRISDOL) 1.25 MG (50000 UNIT) CAPS capsule Take 1 capsule (50,000 Units total) by mouth every 7 (seven) days.   No facility-administered medications prior to visit.   Review of Systems    Objective    BP 136/70 (BP Location: Left Arm, Patient Position: Sitting, Cuff Size: Large)   Pulse 79   Temp 98 F (36.7 C) (Oral)   Resp 12   Ht 5\' 1"  (1.549 m)   Wt 236 lb 4.8 oz (107.2 kg)   SpO2 99%   BMI 44.65 kg/m   Physical Exam Vitals and nursing note reviewed.  Constitutional:      General: She is not in acute distress.    Appearance: Normal appearance. She is obese. She is not ill-appearing, toxic-appearing or diaphoretic.  HENT:     Head: Normocephalic and atraumatic.     Right Ear: Tympanic membrane,  ear canal and external ear normal.     Left Ear: Tympanic membrane, ear canal and external ear normal.     Nose: Nose normal.  Eyes:     Extraocular Movements: Extraocular movements intact.     Conjunctiva/sclera: Conjunctivae normal.     Pupils: Pupils are equal, round, and reactive to light.  Cardiovascular:     Rate and Rhythm: Normal rate and regular rhythm.     Pulses: Normal pulses.     Heart sounds: Normal heart sounds. No murmur heard.    No friction rub. No gallop.  Pulmonary:     Effort: Pulmonary effort is normal. No respiratory distress.     Breath sounds: Normal breath sounds. No stridor. No  wheezing, rhonchi or rales.  Chest:     Chest wall: No tenderness.  Musculoskeletal:        General: No swelling, tenderness, deformity or signs of injury. Normal range of motion.     Cervical back: Normal range of motion and neck supple.     Right lower leg: No edema.     Left lower leg: No edema.  Skin:    General: Skin is warm and dry.     Capillary Refill: Capillary refill takes less than 2 seconds.     Coloration: Skin is not jaundiced or pale.     Findings: No bruising, erythema, lesion or rash.  Neurological:     General: No focal deficit present.     Mental Status: She is alert and oriented to person, place, and time. Mental status is at baseline.     Cranial Nerves: No cranial nerve deficit.     Sensory: No sensory deficit.     Motor: No weakness.     Coordination: Coordination normal.  Psychiatric:        Mood and Affect: Mood normal. Affect is flat.        Behavior: Behavior normal.        Thought Content: Thought content normal.        Judgment: Judgment normal.     No results found for any visits on 09/27/22.  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 Concerning for dehydration; pt previously reported 1 meal in evenings daily. Encouraged to take more water breaks, add electrolyte supplement in morning, and track symptoms to find correlation. If not improved, recommend carotid US      Mood  disturbance    Acute on chronic, worsening Daughter has been looking at colleges with her daughter's father Patient notes some stress concerning the next transition  Continues to decline medication start      Morbid obesity    Chronic, unchanged Will add phentermine to assist Encourage 3 month follow up if patient continues medication  Body mass index is 44.65 kg/m. Continue to recommend balanced, lower  carb meals. Smaller meal size, adding snacks. Choosing water as drink of choice and increasing purposeful exercise.       Relevant Medications   phentermine 37.5 MG capsule   Return in about 3 months (around 12/27/2022) for weight mgmt .     Vonna Kotyk, FNP, have reviewed all documentation for this visit. The documentation on 09/27/22 for the exam, diagnosis, procedures, and orders are all accurate and complete.  Erin Martin, [place] [phone] (phone) [phone] (fax)  [place]

## 2022-09-27 ENCOUNTER — Ambulatory Visit: Payer: Managed Care, Other (non HMO) | Admitting: Family Medicine

## 2022-09-27 ENCOUNTER — Encounter: Payer: Self-pay | Admitting: Family Medicine

## 2022-09-27 VITALS — BP 136/70 | HR 79 | Temp 98.0°F | Resp 12 | Ht 61.0 in | Wt 236.3 lb

## 2022-09-27 DIAGNOSIS — I10 Essential (primary) hypertension: Secondary | ICD-10-CM

## 2022-09-27 DIAGNOSIS — R42 Dizziness and giddiness: Secondary | ICD-10-CM

## 2022-09-27 DIAGNOSIS — R4586 Emotional lability: Secondary | ICD-10-CM | POA: Diagnosis not present

## 2022-09-27 MED ORDER — PHENTERMINE HCL 37.5 MG PO CAPS: 37.5000 mg | ORAL_CAPSULE | ORAL | 2 refills | Status: DC

## 2022-09-27 NOTE — Assessment & Plan Note (Signed)
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 Concerning for dehydration; pt previously reported 1 meal in evenings daily. Encouraged to take more water breaks, add electrolyte supplement in morning, and track symptoms to find correlation. If not improved, recommend carotid US

## 2022-09-27 NOTE — Assessment & Plan Note (Signed)
Chronic, now stable and at goal of 139/79 or less Continue Norvasc 5 mg Repeat labs in 01/2023

## 2022-09-27 NOTE — Assessment & Plan Note (Signed)
Chronic, unchanged Will add phentermine to assist Encourage 3 month follow up if patient continues medication  Body mass index is 44.65 kg/m. Continue to recommend balanced, lower carb meals. Smaller meal size, adding snacks. Choosing water as drink of choice and increasing purposeful exercise.

## 2022-09-27 NOTE — Patient Instructions (Signed)
Please call and schedule your mammogram:  [facility] at [facility]  [address], [address] [facility] [place],  [place]  [address] Get Driving Directions Main: [phone]  Sunday:Closed Monday:7:20 AM - 5:00 PM Tuesday:7:20 AM - 5:00 PM Wednesday:7:20 AM - 5:00 PM Thursday:7:20 AM - 5:00 PM Friday:7:20 AM - 4:30 PM Saturday:Closed  

## 2022-09-27 NOTE — Assessment & Plan Note (Signed)
Acute on chronic, worsening Daughter has been looking at colleges with her daughter's father Patient notes some stress concerning the next transition  Continues to decline medication start

## 2022-10-21 ENCOUNTER — Other Ambulatory Visit: Payer: Self-pay | Admitting: Family Medicine

## 2022-10-22 ENCOUNTER — Other Ambulatory Visit (HOSPITAL_COMMUNITY): Payer: Self-pay

## 2022-10-22 MED ORDER — NAPROXEN SODIUM 220 MG PO TABS
220.0000 mg | ORAL_TABLET | Freq: Two times a day (BID) | ORAL | 0 refills | Status: DC | PRN
  Filled 2022-10-22: qty 60, 30d supply, fill #0

## 2022-11-20 ENCOUNTER — Other Ambulatory Visit: Payer: Self-pay | Admitting: Family Medicine

## 2022-11-21 NOTE — Telephone Encounter (Signed)
Requested Prescriptions  Pending Prescriptions Disp Refills   buPROPion (WELLBUTRIN XL) 150 MG 24 hr tablet [Pharmacy Med Name: buPROPion HCL XL 150 MG TABLET] 90 tablet 0    Sig: TAKE 1 TABLET BY MOUTH DAILY     Psychiatry: Antidepressants - bupropion Passed - 11/20/2022  3:02 PM      Passed - Cr in normal range and within 360 days    Creatinine, Ser  Date Value Ref Range Status  08/08/2022 0.68 0.57 - 1.00 mg/dL Final         Passed - AST in normal range and within 360 days    AST  Date Value Ref Range Status  08/08/2022 16 0 - 40 IU/L Final         Passed - ALT in normal range and within 360 days    ALT  Date Value Ref Range Status  08/08/2022 12 0 - 32 IU/L Final         Passed - Last BP in normal range    BP Readings from Last 1 Encounters:  09/27/22 136/70         Passed - Valid encounter within last 6 months    Recent Outpatient Visits           1 month ago Primary hypertension   [facility] Jacky Kindle, FNP   2 months ago Morbid obesity [facility])   [place] [facility] Merita Norton T, FNP   3 months ago Morbid obesity [facility])   [facility] Jacky Kindle, [place]

## 2022-12-03 ENCOUNTER — Encounter: Payer: Self-pay | Admitting: Family Medicine

## 2023-01-28 ENCOUNTER — Ambulatory Visit: Payer: BLUE CROSS/BLUE SHIELD | Admitting: Family Medicine

## 2023-05-10 LAB — COLOGUARD: COLOGUARD: NEGATIVE

## 2023-05-26 DIAGNOSIS — Z419 Encounter for procedure for purposes other than remedying health state, unspecified: Secondary | ICD-10-CM | POA: Diagnosis not present

## 2023-06-26 DIAGNOSIS — Z419 Encounter for procedure for purposes other than remedying health state, unspecified: Secondary | ICD-10-CM | POA: Diagnosis not present

## 2023-07-27 DIAGNOSIS — Z419 Encounter for procedure for purposes other than remedying health state, unspecified: Secondary | ICD-10-CM | POA: Diagnosis not present

## 2023-08-24 DIAGNOSIS — Z419 Encounter for procedure for purposes other than remedying health state, unspecified: Secondary | ICD-10-CM | POA: Diagnosis not present

## 2023-10-05 DIAGNOSIS — Z419 Encounter for procedure for purposes other than remedying health state, unspecified: Secondary | ICD-10-CM | POA: Diagnosis not present

## 2023-10-28 ENCOUNTER — Emergency Department (HOSPITAL_COMMUNITY)

## 2023-10-28 ENCOUNTER — Inpatient Hospital Stay (HOSPITAL_COMMUNITY)
Admission: EM | Admit: 2023-10-28 | Discharge: 2023-10-31 | DRG: 164 | Disposition: A | Discharge status: Home / Self Care | Attending: Internal Medicine | Admitting: Internal Medicine

## 2023-10-28 ENCOUNTER — Encounter (HOSPITAL_COMMUNITY): Payer: Self-pay

## 2023-10-28 ENCOUNTER — Other Ambulatory Visit: Payer: Self-pay

## 2023-10-28 DIAGNOSIS — Z6841 Body Mass Index (BMI) 40.0 and over, adult: Secondary | ICD-10-CM

## 2023-10-28 DIAGNOSIS — Z7985 Long-term (current) use of injectable non-insulin antidiabetic drugs: Secondary | ICD-10-CM

## 2023-10-28 DIAGNOSIS — I444 Left anterior fascicular block: Secondary | ICD-10-CM | POA: Diagnosis present

## 2023-10-28 DIAGNOSIS — Z79899 Other long term (current) drug therapy: Secondary | ICD-10-CM | POA: Diagnosis not present

## 2023-10-28 DIAGNOSIS — F418 Other specified anxiety disorders: Secondary | ICD-10-CM | POA: Diagnosis present

## 2023-10-28 DIAGNOSIS — Z86711 Personal history of pulmonary embolism: Secondary | ICD-10-CM | POA: Diagnosis not present

## 2023-10-28 DIAGNOSIS — E66813 Obesity, class 3: Secondary | ICD-10-CM | POA: Diagnosis present

## 2023-10-28 DIAGNOSIS — R0602 Shortness of breath: Secondary | ICD-10-CM

## 2023-10-28 DIAGNOSIS — E876 Hypokalemia: Secondary | ICD-10-CM | POA: Diagnosis present

## 2023-10-28 DIAGNOSIS — D508 Other iron deficiency anemias: Secondary | ICD-10-CM | POA: Diagnosis present

## 2023-10-28 DIAGNOSIS — I959 Hypotension, unspecified: Secondary | ICD-10-CM | POA: Diagnosis present

## 2023-10-28 DIAGNOSIS — I10 Essential (primary) hypertension: Secondary | ICD-10-CM | POA: Diagnosis present

## 2023-10-28 DIAGNOSIS — R4586 Emotional lability: Secondary | ICD-10-CM | POA: Diagnosis present

## 2023-10-28 DIAGNOSIS — I2609 Other pulmonary embolism with acute cor pulmonale: Secondary | ICD-10-CM | POA: Diagnosis not present

## 2023-10-28 DIAGNOSIS — R079 Chest pain, unspecified: Secondary | ICD-10-CM

## 2023-10-28 LAB — CBC
HCT: 43.5 % (ref 36.0–46.0)
Hemoglobin: 13.8 g/dL (ref 12.0–15.0)
MCH: 28.9 pg (ref 26.0–34.0)
MCHC: 31.7 g/dL (ref 30.0–36.0)
MCV: 91.2 fL (ref 80.0–100.0)
Platelets: 206 10*3/uL (ref 150–400)
RBC: 4.77 MIL/uL (ref 3.87–5.11)
RDW: 13.2 % (ref 11.5–15.5)
WBC: 5.9 10*3/uL (ref 4.0–10.5)
nRBC: 0 % (ref 0.0–0.2)

## 2023-10-28 LAB — BASIC METABOLIC PANEL WITH GFR
Anion gap: 12 (ref 5–15)
BUN: 13 mg/dL (ref 6–20)
CO2: 21 mmol/L — ABNORMAL LOW (ref 22–32)
Calcium: 9 mg/dL (ref 8.9–10.3)
Chloride: 108 mmol/L (ref 98–111)
Creatinine, Ser: 0.79 mg/dL (ref 0.44–1.00)
GFR, Estimated: >60 mL/min (ref >60)
Glucose, Bld: 107 mg/dL — ABNORMAL HIGH (ref 70–99)
Potassium: 3.1 mmol/L — ABNORMAL LOW (ref 3.5–5.1)
Sodium: 141 mmol/L (ref 135–145)

## 2023-10-28 LAB — TROPONIN I (HIGH SENSITIVITY)
Troponin I (High Sensitivity): 70 ng/L — ABNORMAL HIGH (ref <18)
Troponin I (High Sensitivity): 73 ng/L — ABNORMAL HIGH (ref <18)

## 2023-10-28 MED ORDER — NITROGLYCERIN 0.4 MG SL SUBL
0.4000 mg | SUBLINGUAL_TABLET | SUBLINGUAL | Status: DC | PRN
  Administered 2023-10-28 – 2023-10-29 (×2): 0.4 mg via SUBLINGUAL
  Filled 2023-10-28 (×2): qty 1

## 2023-10-28 MED ORDER — IOHEXOL 300 MG/ML  SOLN
100.0000 mL | Freq: Once | INTRAMUSCULAR | Status: DC | PRN
Start: 2023-10-28 — End: 2023-10-28

## 2023-10-28 MED ORDER — ASPIRIN 325 MG PO TABS
325.0000 mg | ORAL_TABLET | Freq: Every day | ORAL | Status: DC
  Administered 2023-10-28 – 2023-10-31 (×4): 325 mg via ORAL
  Filled 2023-10-28 (×4): qty 1

## 2023-10-28 MED ORDER — HEPARIN BOLUS VIA INFUSION
2000.0000 [IU] | Freq: Once | INTRAVENOUS | Status: AC
  Administered 2023-10-28: 2000 [IU] via INTRAVENOUS
  Filled 2023-10-28: qty 2000

## 2023-10-28 MED ORDER — IOHEXOL 350 MG/ML SOLN
75.0000 mL | Freq: Once | INTRAVENOUS | Status: AC | PRN
  Administered 2023-10-28: 75 mL via INTRAVENOUS

## 2023-10-28 MED ORDER — HEPARIN (PORCINE) 25000 UT/250ML-% IV SOLN
1400.0000 [IU]/h | INTRAVENOUS | Status: DC
Start: 2023-10-28 — End: 2023-10-29
  Administered 2023-10-28: 1400 [IU]/h via INTRAVENOUS
  Filled 2023-10-28 (×2): qty 250

## 2023-10-28 MED ORDER — SODIUM CHLORIDE (PF) 0.9 % IJ SOLN
INTRAMUSCULAR | Status: AC
Start: 2023-10-28 — End: ?
  Filled 2023-10-28: qty 50

## 2023-10-28 NOTE — ED Notes (Signed)
 Pt reports chest pain was not relieved by nitroglycerin tablet.

## 2023-10-28 NOTE — ED Provider Notes (Signed)
 [place] EMERGENCY DEPARTMENT AT [facility] Provider Note   CSN: [id number] Arrival date & time: 10/28/23  1441     History  Chief Complaint  Patient presents with   Chest Pain   Shortness of Breath    Erin Martin is a 58 y.o. female with past medical history of IDA, HTN, BMI 44 presents to Emergency Department for evaluation of chest pain and shortness of breath that started on Saturday.  She endorses that she has had a constant tightness at 5/10 at baseline since Saturday with worsening tightness with exertion.  Tightness is in left chest that radiates into left collarbone.   She also endorses shortness of breath at rest that significantly worsens with mild exertion.  She states that she has had decreased movement overall due to upcoming hip surgery and recent knee surgery in January.  Denies history of clot, recent travel, immobilization, hemoptysis, exogenous hormones, pedal edema, nor recent falls.   Chest Pain Associated symptoms: shortness of breath   Shortness of Breath Associated symptoms: chest pain       Home Medications Prior to Admission medications   Medication Sig Start Date End Date Taking? Authorizing Provider  amLODipine  (NORVASC ) 5 MG tablet Take 1 tablet (5 mg total) by mouth daily. 08/28/22   Normie Becton, FNP  buPROPion  (WELLBUTRIN  XL) 150 MG 24 hr tablet TAKE 1 TABLET BY MOUTH DAILY 11/21/22   Iona Manis T, FNP  naltrexone  (DEPADE) 50 MG tablet Take 0.5 tablets (25 mg total) by mouth daily. 08/28/22   Normie Becton, FNP  naproxen  sodium (ALEVE ) 220 MG tablet Take 1 tablet (220 mg total) by mouth 2 (two) times daily as needed (take with food). 10/22/22   Normie Becton, FNP  phentermine  37.5 MG capsule Take 1 capsule (37.5 mg total) by mouth every morning. 09/27/22   Normie Becton, FNP  Semaglutide -Weight Management 0.5 MG/0.5ML SOAJ Inject 0.5 mg into the skin once a week. 09/11/22   Normie Becton, FNP  silver  sulfADIAZINE  (SILVADENE ) 1 %  cream Apply 1 Application topically daily. 08/08/22   Normie Becton, FNP  Vitamin D , Ergocalciferol , (DRISDOL ) 1.25 MG (50000 UNIT) CAPS capsule Take 1 capsule (50,000 Units total) by mouth every 7 (seven) days. 08/09/22   Normie Becton, FNP      Allergies    Patient has no known allergies.    Review of Systems   Review of Systems  Respiratory:  Positive for shortness of breath.   Cardiovascular:  Positive for chest pain.    Physical Exam Updated Vital Signs BP (!) 133/95 (BP Location: Left Arm)   Pulse (!) 104   Temp 97.9 F (36.6 C) (Oral)   Resp 18   Ht 5\' 1"  (1.549 m)   Wt 107.2 kg   LMP 03/26/2022   SpO2 99%   BMI 44.65 kg/m  Physical Exam Vitals and nursing note reviewed.  Constitutional:      General: She is not in acute distress.    Appearance: Normal appearance. She is not ill-appearing.  HENT:     Head: Normocephalic and atraumatic.  Eyes:     Conjunctiva/sclera: Conjunctivae normal.  Cardiovascular:     Rate and Rhythm: Tachycardia present.     Comments: Initially 129bpm Pulmonary:     Effort: Pulmonary effort is normal. No respiratory distress.     Breath sounds: Normal breath sounds.     Comments: Speaking in full complete sentences without difficulty nor tachypnea.  No oxygen supplementation required Chest:     Chest wall: Tenderness (mild diffuse with palpation) present.  Musculoskeletal:     Right lower leg: No edema.     Left lower leg: No edema.     Comments: No tenderness along venous system in the calf nor thigh.  No pedal edema bilaterally  Skin:    General: Skin is warm.     Capillary Refill: Capillary refill takes less than 2 seconds.     Coloration: Skin is not jaundiced or pale.  Neurological:     Mental Status: She is alert and oriented to person, place, and time. Mental status is at baseline.     ED Results / Procedures / Treatments   Labs (all labs ordered are listed, but only abnormal results are displayed) Labs Reviewed  BASIC  METABOLIC PANEL WITH GFR - Abnormal; Notable for the following components:      Result Value   Potassium 3.1 (*)    CO2 21 (*)    Glucose, Bld 107 (*)    All other components within normal limits  TROPONIN I (HIGH SENSITIVITY) - Abnormal; Notable for the following components:   Troponin I (High Sensitivity) 70 (*)    All other components within normal limits  TROPONIN I (HIGH SENSITIVITY) - Abnormal; Notable for the following components:   Troponin I (High Sensitivity) 73 (*)    All other components within normal limits  CBC  HEPARIN LEVEL (UNFRACTIONATED)  CBC    EKG EKG Interpretation Date/Time:  Monday Oct 28 2023 14:58:29 EDT Ventricular Rate:  124 PR Interval:  134 QRS Duration:  80 QT Interval:  326 QTC Calculation: 468 R Axis:   -56  Text Interpretation: Sinus tachycardia Left anterior fascicular block Minimal voltage criteria for LVH, may be normal variant ( R in aVL ) Possible Anterior infarct , age undetermined Abnormal ECG No previous ECGs available Confirmed by Abby Hocking [phone]) on 10/28/2023 5:52:35 PM  Radiology CT Angio Chest PE W and/or Wo Contrast Result Date: 10/28/2023 CLINICAL DATA:  Concern for pulmonary embolism. EXAM: CT ANGIOGRAPHY CHEST WITH CONTRAST TECHNIQUE: Multidetector CT imaging of the chest was performed using the standard protocol during bolus administration of intravenous contrast. Multiplanar CT image reconstructions and MIPs were obtained to evaluate the vascular anatomy. RADIATION DOSE REDUCTION: This exam was performed according to the departmental dose-optimization program which includes automated exposure control, adjustment of the mA and/or kV according to patient size and/or use of iterative reconstruction technique. CONTRAST:  75mL OMNIPAQUE IOHEXOL 350 MG/ML SOLN COMPARISON:  Chest radiograph dated 10/28/2023. FINDINGS: Cardiovascular: There is no cardiomegaly or pericardial effusion. The thoracic aorta is unremarkable. The origins of the  great vessels of the aortic arch appear patent. Large bilateral central pulmonary artery emboli extending into the lobar branches of the upper and lower lobes. There is a right heart straining with RV/LV ratio of approximately 2. Mediastinum/Nodes: No hilar or mediastinal adenopathy. The esophagus is grossly unremarkable. No mediastinal fluid collection. Lungs/Pleura: Several peripheral nodular densities primarily involving the lung bases measure up to 7 mm in the left lung base. There is a tiny amount of air within a pulmonary nodule at the right lung base (72/13). Findings may represent septic emboli or atypical infection such as fungal infection versus areas of pulmonary infarct. No pleural effusion or pneumothorax. The central airways are patent. Upper Abdomen: No acute abnormality. Musculoskeletal: No acute osseous pathology. Review of the MIP images confirms the above findings. IMPRESSION: 1. Positive for acute  PE with CT evidence of right heart strain (RV/LV Ratio = 2.0) consistent with at least submassive (intermediate risk) PE. The presence of right heart strain has been associated with an increased risk of morbidity and mortality. Please refer to the "Code PE Focused" order set in EPIC. 2. Several peripheral nodular densities primarily involving the lung bases may represent septic emboli or fungal infection versus areas of pulmonary infarct. These results were called by telephone at the time of interpretation on 10/28/2023 at 5:47 pm to provider Paris Bolds , who verbally acknowledged these results. Electronically Signed   By: Angus Bark M.D.   On: 10/28/2023 17:50   DG Chest 2 View Result Date: 10/28/2023 CLINICAL DATA:  Chest pain. EXAM: CHEST - 2 VIEW COMPARISON:  None Available. FINDINGS: No focal consolidation, pleural effusion or pneumothorax. The cardiac silhouette is within limits. No acute osseous pathology. IMPRESSION: No active cardiopulmonary disease. Electronically Signed   By: Angus Bark M.D.   On: 10/28/2023 16:13    Procedures .Critical Care  Performed by: Royann Cords, PA Authorized by: Royann Cords, PA   Critical care provider statement:    Critical care time (minutes):  35   Critical care was necessary to treat or prevent imminent or life-threatening deterioration of the following conditions: Bilateral PE with cor pulmonale.   Critical care was time spent personally by me on the following activities:  Discussions with consultants, evaluation of patient's response to treatment, examination of patient, obtaining history from patient or surrogate, ordering and performing treatments and interventions, ordering and review of laboratory studies, pulse oximetry and re-evaluation of patient's condition   Care discussed with: admitting provider       Medications Ordered in ED Medications  aspirin tablet 325 mg (325 mg Oral Given 10/28/23 1648)  nitroGLYCERIN (NITROSTAT) SL tablet 0.4 mg (0.4 mg Sublingual Given 10/28/23 1655)  heparin ADULT infusion 100 units/mL (25000 units/250mL) (1,400 Units/hr Intravenous Rate/Dose Verify 10/28/23 1958)  iohexol (OMNIPAQUE) 350 MG/ML injection 75 mL (75 mLs Intravenous Contrast Given 10/28/23 1659)  heparin bolus via infusion 2,000 Units (2,000 Units Intravenous Bolus from Bag 10/28/23 1845)    ED Course/ Medical Decision Making/ A&P                                 Medical Decision Making Amount and/or Complexity of Data Reviewed Labs: ordered. Radiology: ordered.  Risk OTC drugs. Prescription drug management. Decision regarding hospitalization.   Patient presents to the ED for concern of chest pain, shortness of breath, this involves an extensive number of treatment options, and is a complaint that carries with it a high risk of complications and morbidity.  The differential diagnosis includes ACS, PE, pneumonia, infection, electrolyte abnormality, angina, dissection   Co morbidities that complicate the patient  evaluation  None   Additional history obtained:  Additional history obtained from Nursing   External records from outside source obtained and reviewed including triage note   Lab Tests:  I Ordered, and personally interpreted labs.  The pertinent results include:   K 3.1 CBG 107 Troponin 70   Imaging Studies ordered:  I ordered imaging studies including XR, CTPE  I independently visualized and interpreted imaging which showed  Positive for acute PE with CT evidence of right heart strain (RV/LV Ratio = 2.0) consistent with at least submassive (intermediate risk) PE.  Several peripheral nodular densities primarily involving the lung bases may represent  septic emboli or fungal infection versus areas of pulmonary infarct. I agree with the radiologist interpretation   Cardiac Monitoring:  The patient was maintained on a cardiac monitor.  I personally viewed and interpreted the cardiac monitored which showed an underlying rhythm of: ST   Medicines ordered and prescription drug management:  I ordered medication including ASA, NTG  for CP and heparin for PE Reevaluation of the patient after these medicines showed that the patient stayed the same I have reviewed the patients home medicines and have made adjustments as needed     Consultations Obtained:  I requested consultation with Critical Care Dr. Maire Scot,  and discussed lab and imaging findings as well as pertinent plan - they recommend: hospitalist admission. No need for ICU admission I requested consultation with hospitalist, and discussed lab and imaging findings as well as pertinent plan-Dr. Sudie Ely accepts patient for admission   Problem List / ED Course:  Chest pain Shortness of breath Tachycardic upon arrival at 129 bpm Endorses some decreased movement over the past few months since her for surgery in January and with upcoming hip surgery and chronic hip pain No pedal edema nor obvious signs  of DVT in the lower extremities EKG notable for sinus tachycardia at 124.  No previous to compare to.  No inverted T waves nor ST abnormalities Initial troponin 70.  No previous to compare to.  No history of CKD nor other chronic conditions to elevate troponin.  Second troponin 73 Provided 324 aspirin and nitroglycerin for chest pain CTPE significant for bilateral PE with cor pulmonale Hemodynamically stable with no oxygen requirement.  No hypoxia Provided heparin Will admit to hospitalist service   Reevaluation:  After the interventions noted above, I reevaluated the patient and found that they have :stayed the same     Dispostion:  After consideration of the diagnostic results and the patients response to treatment, I feel that the patent would benefit from admission to hospitalist service.   Discussed ED workup, disposition with patient expresses understanding agrees with plan.  All questions answered to her satisfaction.  She is agreeable to plan Final Clinical Impression(s) / ED Diagnoses Final diagnoses:  Other acute pulmonary embolism with acute cor pulmonale (HCC)  Shortness of breath  Chest pain, unspecified type    Rx / DC Orders ED Discharge Orders     None         Royann Cords, PA 10/28/23 2015    Roberts Ching, MD 10/30/23 1744

## 2023-10-28 NOTE — Progress Notes (Signed)
 PHARMACY - ANTICOAGULATION CONSULT NOTE  Pharmacy Consult for IV UFH Indication: pulmonary embolus  No Known Allergies  Patient Measurements: Height: 5\' 1"  (154.9 cm) Weight: 107.2 kg (236 lb 5.3 oz) IBW/kg (Calculated) : 47.8 HEPARIN DW (KG): 74  Vital Signs: Temp: 98.4 F (36.9 C) (05/05 1729) Temp Source: Oral (05/05 1729) BP: 133/72 (05/05 1729) Pulse Rate: 108 (05/05 1729)  Labs: Recent Labs    10/28/23 1516 10/28/23 1654  HGB 13.8  --   HCT 43.5  --   PLT 206  --   CREATININE 0.79  --   TROPONINIHS 70* 73*    Estimated Creatinine Clearance: 88.8 mL/min (by C-G formula based on SCr of 0.79 mg/dL).   Medical History: History reviewed. No pertinent past medical history.  Medications:  Scheduled:   aspirin  325 mg Oral Daily   Infusions:   Assessment: Pt presented to ER with CP and shortness of breath found to have new PE with right heart strain. To start IV heparin. Baseline labs already done. Not on anticoagulation PTA.  Goal of Therapy:  Heparin level 0.3-0.7 units/ml Monitor platelets by anticoagulation protocol: Yes   Plan:  IV heparin 2000 unit bolus then IV heparin rate of 1400 units/hr Check heparin level in 6 hours Daily CBC and heparin level   Bernett Brill 10/28/2023,6:05 PM

## 2023-10-28 NOTE — ED Notes (Signed)
 ED tech assisted pt to bathroom. Pt ambulatory to bathroom with steady gait. Pt leaving bathroom winded and SOB. Pt assisted back to bed by RN and ED tech. Pt 100% on RA. Pt repositioned comfortably in bed

## 2023-10-28 NOTE — H&P (Signed)
  History and Physical    Patient: Erin Martin NWG:[id number] DOB: December 05, 1965 DOA: 10/28/2023 DOS: the patient was seen and examined on 10/28/2023 PCP: Erin Eriksson, MD  Patient coming from: {Point_of_Origin:[address]}  Chief Complaint:  Chief Complaint  Patient presents with   Chest Pain   Shortness of Breath   HPI: Erin Martin is a 58 y.o. female with medical history significant of ***  Review of Systems: {ROS_Text:26778} History reviewed. No pertinent past medical history. Past Surgical History:  Procedure Laterality Date   c-section     Social History:  reports that she has never smoked. She has been exposed to tobacco smoke. She has never used smokeless tobacco. She reports that she does not currently use alcohol. She reports that she does not use drugs.  No Known Allergies  History reviewed. No pertinent family history.  Prior to Admission medications   Medication Sig Start Date End Date Taking? Authorizing Provider  amLODipine  (NORVASC ) 5 MG tablet Take 1 tablet (5 mg total) by mouth daily. 08/28/22   Normie Becton, FNP  buPROPion  (WELLBUTRIN  XL) 150 MG 24 hr tablet TAKE 1 TABLET BY MOUTH DAILY 11/21/22   Iona Manis T, FNP  naltrexone  (DEPADE) 50 MG tablet Take 0.5 tablets (25 mg total) by mouth daily. 08/28/22   Normie Becton, FNP  naproxen  sodium (ALEVE ) 220 MG tablet Take 1 tablet (220 mg total) by mouth 2 (two) times daily as needed (take with food). 10/22/22   Normie Becton, FNP  phentermine  37.5 MG capsule Take 1 capsule (37.5 mg total) by mouth every morning. 09/27/22   Normie Becton, FNP  Semaglutide -Weight Management 0.5 MG/0.5ML SOAJ Inject 0.5 mg into the skin once a week. 09/11/22   Normie Becton, FNP  silver  sulfADIAZINE  (SILVADENE ) 1 % cream Apply 1 Application topically daily. 08/08/22   Normie Becton, FNP  Vitamin D , Ergocalciferol , (DRISDOL ) 1.25 MG (50000 UNIT) CAPS capsule Take 1 capsule (50,000 Units total) by mouth every 7 (seven) days. 08/09/22    Normie Becton, FNP    Physical Exam: Vitals:   10/28/23 1451 10/28/23 1729 10/28/23 1840  BP: (!) 135/102 133/72 (!) 133/95  Pulse: (!) 129 (!) 108 (!) 104  Resp: 20 18 18   Temp: 98.2 F (36.8 C) 98.4 F (36.9 C) 97.9 F (36.6 C)  TempSrc: Oral Oral Oral  SpO2: 99% 96% 99%  Weight: 107.2 kg    Height: 5\' 1"  (1.549 m)     *** Data Reviewed: {Tip this will not be part of the note when signed- Document your independent interpretation of telemetry tracing, EKG, lab, Radiology test or any other diagnostic tests. Add any new diagnostic test ordered today. (Optional):26781} {Results:26384}  Assessment and Plan: No notes have been filed under this hospital service. Service: Hospitalist     Advance Care Planning:   Code Status: Full Code ***  Consults: ***  Family Communication: ***  Severity of Illness: {Observation/Inpatient:21159}  AuthorCarolin Chyle, MD 10/28/2023 8:10 PM  For on call review [email or web address].

## 2023-10-28 NOTE — ED Triage Notes (Signed)
 Pt having chest pain and shob since Saturday,getting worse.CP towards left side radiating down chest. Denies any recent travels, leg swelling or recent know illness or exposure to. Denies any other symptoms

## 2023-10-29 ENCOUNTER — Inpatient Hospital Stay (HOSPITAL_COMMUNITY)

## 2023-10-29 DIAGNOSIS — I2609 Other pulmonary embolism with acute cor pulmonale: Secondary | ICD-10-CM | POA: Diagnosis not present

## 2023-10-29 HISTORY — PX: IR THROMBECT PRIM MECH INIT (INCLU) MOD SED: IMG2297

## 2023-10-29 HISTORY — PX: IR US GUIDE VASC ACCESS RIGHT: IMG2390

## 2023-10-29 HISTORY — PX: IR THROMBECT PRIM MECH ADD (INCLU) MOD SED: IMG2298

## 2023-10-29 HISTORY — PX: IR ANGIOGRAM PULMONARY BILATERAL SELECTIVE: IMG664

## 2023-10-29 LAB — HEPARIN LEVEL (UNFRACTIONATED)
Heparin Unfractionated: 0.46 [IU]/mL (ref 0.30–0.70)
Heparin Unfractionated: 0.7 [IU]/mL (ref 0.30–0.70)
Heparin Unfractionated: >1.1 [IU]/mL — ABNORMAL HIGH (ref 0.30–0.70)

## 2023-10-29 LAB — COMPREHENSIVE METABOLIC PANEL WITH GFR
ALT: 12 U/L (ref 0–44)
AST: 19 U/L (ref 15–41)
Albumin: 3.4 g/dL — ABNORMAL LOW (ref 3.5–5.0)
Alkaline Phosphatase: 53 U/L (ref 38–126)
Anion gap: 11 (ref 5–15)
BUN: 13 mg/dL (ref 6–20)
CO2: 25 mmol/L (ref 22–32)
Calcium: 8.9 mg/dL (ref 8.9–10.3)
Chloride: 104 mmol/L (ref 98–111)
Creatinine, Ser: 0.8 mg/dL (ref 0.44–1.00)
GFR, Estimated: >60 mL/min (ref >60)
Glucose, Bld: 113 mg/dL — ABNORMAL HIGH (ref 70–99)
Potassium: 2.8 mmol/L — ABNORMAL LOW (ref 3.5–5.1)
Sodium: 140 mmol/L (ref 135–145)
Total Bilirubin: 1.3 mg/dL — ABNORMAL HIGH (ref 0.0–1.2)
Total Protein: 7.5 g/dL (ref 6.5–8.1)

## 2023-10-29 LAB — CBC
HCT: 38.5 % (ref 36.0–46.0)
HCT: 40.9 % (ref 36.0–46.0)
Hemoglobin: 12.4 g/dL (ref 12.0–15.0)
Hemoglobin: 12.9 g/dL (ref 12.0–15.0)
MCH: 29.2 pg (ref 26.0–34.0)
MCH: 29 pg (ref 26.0–34.0)
MCHC: 32.2 g/dL (ref 30.0–36.0)
MCHC: 31.5 g/dL (ref 30.0–36.0)
MCV: 90.8 fL (ref 80.0–100.0)
MCV: 91.9 fL (ref 80.0–100.0)
Platelets: 179 10*3/uL (ref 150–400)
Platelets: 189 10*3/uL (ref 150–400)
RBC: 4.24 MIL/uL (ref 3.87–5.11)
RBC: 4.45 MIL/uL (ref 3.87–5.11)
RDW: 13.3 % (ref 11.5–15.5)
RDW: 13.6 % (ref 11.5–15.5)
WBC: 6.8 10*3/uL (ref 4.0–10.5)
WBC: 7.1 10*3/uL (ref 4.0–10.5)
nRBC: 0 % (ref 0.0–0.2)
nRBC: 0 % (ref 0.0–0.2)

## 2023-10-29 LAB — POCT ACTIVATED CLOTTING TIME
Activated Clotting Time: 204 s
Activated Clotting Time: 233 s

## 2023-10-29 LAB — PROTIME-INR
INR: 1.1 (ref 0.8–1.2)
Prothrombin Time: 14.6 s (ref 11.4–15.2)

## 2023-10-29 LAB — HIV ANTIBODY (ROUTINE TESTING W REFLEX): HIV Screen 4th Generation wRfx: NONREACTIVE

## 2023-10-29 MED ORDER — ONDANSETRON HCL 4 MG PO TABS: 4.0000 mg | ORAL_TABLET | Freq: Four times a day (QID) | ORAL | Status: DC | PRN

## 2023-10-29 MED ORDER — MORPHINE SULFATE (PF) 2 MG/ML IV SOLN
2.0000 mg | INTRAVENOUS | Status: DC | PRN
  Administered 2023-10-29 – 2023-10-30 (×7): 2 mg via INTRAVENOUS
  Filled 2023-10-29 (×7): qty 1

## 2023-10-29 MED ORDER — ONDANSETRON HCL 4 MG/2ML IJ SOLN
4.0000 mg | Freq: Four times a day (QID) | INTRAMUSCULAR | Status: DC | PRN
  Administered 2023-10-29: 4 mg via INTRAVENOUS
  Filled 2023-10-29: qty 2

## 2023-10-29 MED ORDER — HEPARIN SODIUM (PORCINE) 1000 UNIT/ML IJ SOLN
INTRAMUSCULAR | Status: DC | PRN
  Administered 2023-10-29: 5000 [IU] via INTRAVENOUS

## 2023-10-29 MED ORDER — IOHEXOL 350 MG/ML SOLN
100.0000 mL | Freq: Once | INTRAVENOUS | Status: AC | PRN
  Administered 2023-10-29: 60 mL via INTRAVENOUS

## 2023-10-29 MED ORDER — MIDAZOLAM HCL 2 MG/2ML IJ SOLN
INTRAMUSCULAR | Status: AC
  Filled 2023-10-29: qty 4

## 2023-10-29 MED ORDER — FENTANYL CITRATE (PF) 100 MCG/2ML IJ SOLN
INTRAMUSCULAR | Status: AC
  Filled 2023-10-29: qty 4

## 2023-10-29 MED ORDER — HEPARIN SODIUM (PORCINE) 1000 UNIT/ML IJ SOLN
INTRAMUSCULAR | Status: AC
  Filled 2023-10-29: qty 10

## 2023-10-29 MED ORDER — LIDOCAINE HCL 1 % IJ SOLN
INTRAMUSCULAR | Status: AC
  Filled 2023-10-29: qty 20

## 2023-10-29 MED ORDER — POTASSIUM CHLORIDE CRYS ER 20 MEQ PO TBCR
40.0000 meq | EXTENDED_RELEASE_TABLET | ORAL | Status: AC
  Administered 2023-10-29: 40 meq via ORAL
  Filled 2023-10-29: qty 2

## 2023-10-29 MED ORDER — SODIUM CHLORIDE 0.9 % IV BOLUS
500.0000 mL | Freq: Once | INTRAVENOUS | Status: AC
  Administered 2023-10-29: 500 mL via INTRAVENOUS

## 2023-10-29 MED ORDER — POTASSIUM CHLORIDE CRYS ER 20 MEQ PO TBCR
40.0000 meq | EXTENDED_RELEASE_TABLET | Freq: Once | ORAL | Status: AC
  Administered 2023-10-29: 40 meq via ORAL
  Filled 2023-10-29: qty 2

## 2023-10-29 MED ORDER — FENTANYL CITRATE (PF) 100 MCG/2ML IJ SOLN
INTRAMUSCULAR | Status: DC | PRN
  Administered 2023-10-29 (×4): 50 ug via INTRAVENOUS

## 2023-10-29 MED ORDER — MIDAZOLAM HCL 2 MG/2ML IJ SOLN
INTRAMUSCULAR | Status: DC | PRN
  Administered 2023-10-29 (×4): 1 mg via INTRAVENOUS

## 2023-10-29 MED ORDER — AMLODIPINE BESYLATE 5 MG PO TABS
5.0000 mg | ORAL_TABLET | Freq: Every day | ORAL | Status: DC
  Administered 2023-10-30: 5 mg via ORAL
  Filled 2023-10-29 (×2): qty 1

## 2023-10-29 MED ORDER — IOHEXOL 300 MG/ML  SOLN
50.0000 mL | Freq: Once | INTRAMUSCULAR | Status: AC | PRN
  Administered 2023-10-29: 20 mL via INTRAVENOUS

## 2023-10-29 MED ORDER — KCL-LACTATED RINGERS-D5W 20 MEQ/L IV SOLN
INTRAVENOUS | Status: DC
  Filled 2023-10-29: qty 1000

## 2023-10-29 MED ORDER — HEPARIN (PORCINE) 25000 UT/250ML-% IV SOLN
1000.0000 [IU]/h | INTRAVENOUS | Status: AC
  Administered 2023-10-29 – 2023-10-30 (×2): 1000 [IU]/h via INTRAVENOUS
  Filled 2023-10-29: qty 250

## 2023-10-29 NOTE — Progress Notes (Signed)
 Progress Note   Patient: Erin Martin ZOX:[id number] DOB: 1966-05-30 DOA: 10/28/2023     1 DOS: the patient was seen and examined on 10/29/2023   Brief hospital course: 58yo with h/o HTN, IDA, depression, and morbid obesity who presented on 5/5 with CP/SOB.  CTA with B PE with R heart strain.  Echo is pending.  Underwent thrombectomy on 5/6 by IR.  Assessment and Plan:  Acute pulmonary emboli Patient without prior episodes of thromboembolic disease presenting with new PE She was having persistent CP and tachycardia with periodic hypotension and so IR was consulted for thrombectomy PESI score is Class V, very high risk, indicating a 10-24%% 30-day mortality risk S-PESI score is also high, indicating that the patient has an 8.9% risk of death and a 1.5% risk of recurrent VTE or non-fatal bleeding Admitted on telemetry to progressive care unit R heart strain was seen on CT; echo is pending With high PESI/S-PESI score and R heart strain, she is at intermediate risk  Initiated anticoagulation with treatment-dose heparin with plan to transition to alternative [facility] agent tomorrow or the following day Now s/p thrombectomy, which she tolerated well [place] O2 as needed We discussed oral AC treatment options and risks/benefits including frequent MD visits and dietary regulation with Coumadin vs. Significant expense with DOAC therapy Will request pharmacy team consultation to assist with cost analysis of the various DOAC options based on her insurance Clot likely developed from immobility in the setting of hip pain (due for upcoming hip replacement, which will now need to be delayed for some time), placing her at intermediate risk (57-8%/year) for recurrent VTE.  Extended oral anticoagulation of indefinite duration should be considered for patients with a first episode of PE with these issues. The patient understands that thromboembolic disease can be catastrophic and even deadly and that she must be  complaint with physician appointments and anticoagulation. DVT US  ordered; while the legs are normal in appearance and non-painful, if the patient does have an underlying DVT then an IVC filter might need to be considered - particularly if the patient is unable to tolerate OAC therapy    Essential hypertension Continue amlodipine     Morbid obesity Body mass index is 44.65 kg/m.Aaron Aas  Weight loss should be encouraged Outpatient PCP/bariatric medicine f/u encouraged Significantly low or high BMI is associated with higher medical risk including morbidity and mortality  58 has been on Zepbound , which may be a contributing VTE factor although this is less likely than her chronic immobility and weight   Depression with anxiety No longer taking bupropion  or other medications   Iron deficiency anemia Hemoglobin is normal now Continue to monitor while on [facility]     Consultants: PCCM, telephone only IR  Procedures: Thrombectomy 5/6  Antibiotics: None   30 Day Unplanned Readmission Risk Score    Flowsheet Row ED to Hosp-Admission (Current) from 10/28/2023 in [facility] Emergency Department at [facility]  30 Day Unplanned Readmission Risk Score (%) 7.87 Filed at 10/29/2023 0401       This score is the patient's risk of an unplanned readmission within 30 days of being discharged (0 -100%). The score is based on dignosis, age, lab data, medications, orders, and past utilization.   Low:  0-14.9   Medium: 15-21.9   High: 22-29.9   Extreme: 30 and above          Subjective: Still having some chest discomfort and SOB (no hypoxia) this AM; she has since undergone successful thrombectomy.  Physical Exam: Vitals:   10/29/23 1545 10/29/23 1550 10/29/23 1621 10/29/23 1722  BP: (!) 135/104 (!) 149/105 119/62 114/68  Pulse: (!) 108 (!) 106 (!) 111 (!) 113  Resp: 16 13 16 20   Temp:   97.6 F (36.4 C) 98.2 F (36.8 C)  TempSrc:   Oral   SpO2: 98% 97% 100% 98%  Weight:       Height:          Intake/Output Summary (Last 24 hours) at 10/29/2023 1724 Last data filed at 10/29/2023 0945 Gross per 24 hour  Intake 500 ml  Output --  Net 500 ml   Filed Weights   10/28/23 1451  Weight: 107.2 kg    Exam:  General:  Appears calm and comfortable and is in NAD, mildly increased WOB Eyes:   EOMI, normal lids, iris ENT:  grossly normal hearing, lips & tongue, mmm Neck:  no LAD, masses or thyromegaly Cardiovascular:  RR with tachycardia to 120s. No LE edema.  Respiratory:   CTA bilaterally with no wheezes/rales/rhonchi.  Mildly to moderately increased respiratory effort. Abdomen:  soft, NT, ND Skin:  no rash or induration seen on limited exam Musculoskeletal:  grossly normal tone BUE/BLE, good ROM, no bony abnormality Psychiatric:  blunted mood and affect, speech fluent and appropriate, AOx3 Neurologic:  CN 2-12 grossly intact, moves all extremities in coordinated fashion    Data Reviewed: I have reviewed the patient's lab results since admission.  Pertinent labs for today include:  K+ 2.8 Glucose 113 Albumin 3.4 Bili 1.3 Normal CBC     Family Communication: None present  Disposition: Status is: Inpatient Remains inpatient appropriate because: ongoing monitoring  Planned Discharge Destination: Home    Time spent: 50 minutes  Author: Lorita Rosa, MD 10/29/2023 5:24 PM  For on call review [email or web address].

## 2023-10-29 NOTE — Consult Note (Signed)
 Chief Complaint: Left chest pain, dyspnea, nausea, large bilateral pulmonary emboli, right heart strain; referred for pulmonary arteriogram with catheter directed thrombectomy of pulmonary emboli  Referring Provider(s): Mose Arena  Supervising Physician: Creasie Doctor  Patient Status: [facility] - ED  History of Present Illness: Erin Martin is a 58 y.o. female non-smoker with past medical history significant for hypertension, obesity, iron deficiency anemia, osteoarthritis, anxiety/depression who presented to [facility] on 10/28/23 with primarily left-sided pleuritic chest pain and dyspnea with exertion since Saturday.  Patient denies any recent travels, birth control use, fever, chills, vomiting or diarrhea.  She has had some occasional nausea and some lower abdominal discomfort.  She is on semaglutide  for weight management.  She had arthroscopic left knee surgery in January of this year.  She denies any history of cancer, or previous blood clots.  In the ED patient was found to be tachycardic with increased respiratory rate as well as hypokalemic.  Troponins were also mildly elevated.  CT angiogram of the chest showed bilateral pulmonary emboli with evidence of right heart strain.  There were also several peripheral nodular densities primarily involving the lung bases which may represent septic emboli or fungal infection versus areas of pulmonary infarct.  She is currently on IV heparin.  Echocardiogram pending.  Lower extremity venous Doppler studies pending.  Request now received from [facility] for consideration of catheter directed pulmonary emboli  thrombectomy.  Current labs include normal CBC, potassium 2.8, creatinine normal, total bilirubin 1.3, troponin yesterday of 73.  Vital signs at time of exam afebrile, heart rate 125, BP 115/62, O2 sats 97% on room air, respiratory rate 21.   Patient is Full Code  History reviewed. No pertinent past medical history.  Past Surgical History:  Procedure  Laterality Date   c-section      Allergies: Patient has no known allergies.  Medications: Prior to Admission medications   Medication Sig Start Date End Date Taking? Authorizing Provider  amLODipine  (NORVASC ) 5 MG tablet Take 1 tablet (5 mg total) by mouth daily. 08/28/22  Yes Normie Becton, FNP  tirzepatide  (ZEPBOUND ) 2.5 MG/0.5ML Pen Inject 2.5 mg into the skin once a week. Wednesdays 08/12/23  Yes [provider]  buPROPion  (WELLBUTRIN  XL) 150 MG 24 hr tablet TAKE 1 TABLET BY MOUTH DAILY Patient not taking: Reported on 10/28/2023 11/21/22   Normie Becton, FNP  naltrexone  (DEPADE) 50 MG tablet Take 0.5 tablets (25 mg total) by mouth daily. Patient not taking: Reported on 10/28/2023 08/28/22   Normie Becton, FNP  naproxen  sodium (ALEVE ) 220 MG tablet Take 1 tablet (220 mg total) by mouth 2 (two) times daily as needed (take with food). Patient not taking: Reported on 10/28/2023 10/22/22   Iona Manis T, FNP  oxyCODONE (OXY IR/ROXICODONE) 5 MG immediate release tablet Take 5 mg by mouth every 4 (four) hours as needed for moderate pain (pain score 4-6). Patient not taking: Reported on 10/28/2023 07/10/23   [provider]  phentermine  37.5 MG capsule Take 1 capsule (37.5 mg total) by mouth every morning. Patient not taking: Reported on 10/28/2023 09/27/22   Iona Manis T, FNP  Semaglutide -Weight Management 0.5 MG/0.5ML SOAJ Inject 0.5 mg into the skin once a week. Patient not taking: Reported on 10/28/2023 09/11/22   Iona Manis T, FNP  silver  sulfADIAZINE  (SILVADENE ) 1 % cream Apply 1 Application topically daily. Patient not taking: Reported on 10/28/2023 08/08/22   Iona Manis T, FNP  Vitamin D , Ergocalciferol , (DRISDOL ) 1.25 MG (50000  UNIT) CAPS capsule Take 1 capsule (50,000 Units total) by mouth every 7 (seven) days. Patient not taking: Reported on 10/28/2023 08/09/22   Normie Becton, FNP     History reviewed. No pertinent family history.  Social History   Socioeconomic History    Marital status: Single    Spouse name: Not on file   Number of children: 3   Years of education: Not on file   Highest education level: Not on file  Occupational History   Not on file  Tobacco Use   Smoking status: Never    Passive exposure: Past   Smokeless tobacco: Never  Substance and Sexual Activity   Alcohol use: Not Currently    Comment: wine 1-2 times/month   Drug use: No   Sexual activity: Yes    Partners: Female  Other Topics Concern   Not on file  Social History Narrative   Not on file   Social Drivers of Health   Financial Resource Strain: Not on file  Food Insecurity: Low Risk  (10/16/2023)   Received from [facility]   Hunger Vital Sign    Worried About Running Out of Food in the Last Year: Never true    Ran Out of Food in the Last Year: Never true  Transportation Needs: No Transportation Needs (10/16/2023)   Received from [facility]    In the past 12 months, has lack of reliable transportation kept you from medical appointments, meetings, work or from getting things needed for daily living? : No  Physical Activity: Not on file  Stress: Not on file  Social Connections: Not on file       Review of Systems see above  Vital Signs: BP 115/62   Pulse (!) 123   Temp 98.3 F (36.8 C) (Oral)   Resp (!) 21   Ht 5\' 1"  (1.549 m)   Wt 236 lb 5.3 oz (107.2 kg)   LMP 03/26/2022   SpO2 95%   BMI 44.65 kg/m   Advance Care Plan: No documents on file.  Physical Exam: Awake, alert.  Chest clear to auscultation bilaterally.  Heart with tachycardic but regular rhythm.  Abdomen obese, soft, positive bowel sounds, currently nontender.  No significant lower extremity edema.  Imaging: CT Angio Chest PE W and/or Wo Contrast Result Date: 10/28/2023 CLINICAL DATA:  Concern for pulmonary embolism. EXAM: CT ANGIOGRAPHY CHEST WITH CONTRAST TECHNIQUE: Multidetector CT imaging of the chest was performed using the standard protocol during bolus  administration of intravenous contrast. Multiplanar CT image reconstructions and MIPs were obtained to evaluate the vascular anatomy. RADIATION DOSE REDUCTION: This exam was performed according to the departmental dose-optimization program which includes automated exposure control, adjustment of the mA and/or kV according to patient size and/or use of iterative reconstruction technique. CONTRAST:  75mL OMNIPAQUE IOHEXOL 350 MG/ML SOLN COMPARISON:  Chest radiograph dated 10/28/2023. FINDINGS: Cardiovascular: There is no cardiomegaly or pericardial effusion. The thoracic aorta is unremarkable. The origins of the great vessels of the aortic arch appear patent. Large bilateral central pulmonary artery emboli extending into the lobar branches of the upper and lower lobes. There is a right heart straining with RV/LV ratio of approximately 2. Mediastinum/Nodes: No hilar or mediastinal adenopathy. The esophagus is grossly unremarkable. No mediastinal fluid collection. Lungs/Pleura: Several peripheral nodular densities primarily involving the lung bases measure up to 7 mm in the left lung base. There is a tiny amount of air within a pulmonary nodule at the right lung base (  72/13). Findings may represent septic emboli or atypical infection such as fungal infection versus areas of pulmonary infarct. No pleural effusion or pneumothorax. The central airways are patent. Upper Abdomen: No acute abnormality. Musculoskeletal: No acute osseous pathology. Review of the MIP images confirms the above findings. IMPRESSION: 1. Positive for acute PE with CT evidence of right heart strain (RV/LV Ratio = 2.0) consistent with at least submassive (intermediate risk) PE. The presence of right heart strain has been associated with an increased risk of morbidity and mortality. Please refer to the "Code PE Focused" order set in EPIC. 2. Several peripheral nodular densities primarily involving the lung bases may represent septic emboli or fungal  infection versus areas of pulmonary infarct. These results were called by telephone at the time of interpretation on 10/28/2023 at 5:47 pm to provider Paris Bolds , who verbally acknowledged these results. Electronically Signed   By: Angus Bark M.D.   On: 10/28/2023 17:50   DG Chest 2 View Result Date: 10/28/2023 CLINICAL DATA:  Chest pain. EXAM: CHEST - 2 VIEW COMPARISON:  None Available. FINDINGS: No focal consolidation, pleural effusion or pneumothorax. The cardiac silhouette is within limits. No acute osseous pathology. IMPRESSION: No active cardiopulmonary disease. Electronically Signed   By: Angus Bark M.D.   On: 10/28/2023 16:13    Labs:  CBC: Recent Labs    10/28/23 1516 10/29/23 0144 10/29/23 0714  WBC 5.9 6.8 7.1  HGB 13.8 12.4 12.9  HCT 43.5 38.5 40.9  PLT 206 179 189    COAGS: No results for input(s): "INR", "APTT" in the last 8760 hours.  BMP: Recent Labs    10/28/23 1516 10/29/23 0714  NA 141 140  K 3.1* 2.8*  CL 108 104  CO2 21* 25  GLUCOSE 107* 113*  BUN 13 13  CALCIUM 9.0 8.9  CREATININE 0.79 0.80  GFRNONAA >60 >60    LIVER FUNCTION TESTS: Recent Labs    10/29/23 0714  BILITOT 1.3*  AST 19  ALT 12  ALKPHOS 53  PROT 7.5  ALBUMIN 3.4*    TUMOR MARKERS: No results for input(s): "AFPTM", "CEA", "CA199", "CHROMGRNA" in the last 8760 hours.  Assessment and Plan: 58 y.o. female non-smoker with past medical history significant for hypertension, obesity, iron deficiency anemia, osteoarthritis, anxiety/depression who [facility] on 10/28/23 with primarily left-sided pleuritic chest pain and dyspnea with exertion since Saturday.  Patient denies any recent travels, birth control use, fever, chills, vomiting or diarrhea.  She has had some occasional nausea and some lower abdominal discomfort.  She is on semaglutide  for weight management.  She had arthroscopic left knee surgery in January of this year.  She denies any history of  cancer, or previous blood clots.  In the ED patient was found to be tachycardic with increased respiratory rate as well as hypokalemic.  Troponins were also mildly elevated.  CT angiogram of the chest showed bilateral pulmonary emboli with evidence of right heart strain.  There were also several peripheral nodular densities primarily involving the lung bases which may represent septic emboli or fungal infection versus areas of pulmonary infarct.  She is currently on IV heparin.  Echocardiogram pending.  Lower extremity venous Doppler studies pending.  Request now received from [facility] for consideration of catheter directed pulmonary emboli  thrombectomy.  Current labs include normal CBC, potassium 2.8, creatinine normal, total bilirubin 1.3, troponin yesterday of 73.  Vital signs at time of exam afebrile, heart rate 125, BP 115/62, O2 sats  97% on room air, respiratory rate 21.  Imaging studies /case have been reviewed by Dr. Jinx Mourning and patient deemed an appropriate candidate for catheter directed pulmonary emboli thrombectomy.  Details/risks of procedure, including but not limited to, internal bleeding, infection, injury to adjacent structures, inability to completely evacuate pulmonary emboli.  Procedure scheduled for today.   Thank you for allowing our service to participate in Erin Martin 's care.  Electronically Signed: D. Honore Lux, PA-C   10/29/2023, 10:29 AM      I spent a total of 40 Minutes   in face to face in clinical consultation, greater than 50% of which was counseling/coordinating care for pulmonary arteriogram with catheter directed pulmonary emboli thrombectomy

## 2023-10-29 NOTE — ED Notes (Signed)
Dr. Yates at bedside.  

## 2023-10-29 NOTE — TOC Initial Note (Signed)
 Transition of Care [facility]) - Initial/Assessment Note    Patient Details  Name: Erin Martin MRN: [id number] Date of Birth: [birth year]  Transition of [facility] CM/SW Contact:    Ruben Corolla, RN Phone Number: 10/29/2023, 4:59 PM  Clinical Narrative:   d/c plan home.                Expected Discharge Plan: Home/Self Care Barriers to Discharge: Continued Medical Work up   Patient Goals and CMS Choice Patient states their goals for this hospitalization and ongoing recovery are:: home          Expected Discharge Plan and Services                                              Prior Living Arrangements/Services                       Activities of Daily Living   ADL Screening (condition at time of admission) Independently performs ADLs?: Yes (appropriate for developmental age) Is the patient deaf or have difficulty hearing?: No Does the patient have difficulty seeing, even when wearing glasses/contacts?: No Does the patient have difficulty concentrating, remembering, or making decisions?: No  Permission Sought/Granted                  Emotional Assessment              Admission diagnosis:  Shortness of breath [R06.02] Other acute pulmonary embolism with acute cor pulmonale (HCC) [I26.09] Chest pain, unspecified type [R07.9] Acute pulmonary embolism with acute cor pulmonale (HCC) [I26.09] Patient Active Problem List   Diagnosis Date Noted   Acute pulmonary embolism with acute cor pulmonale (HCC) 10/28/2023   Mood disturbance 09/27/2022   Dizziness 09/27/2022   Screening for cervical cancer 08/28/2022   Primary hypertension 08/28/2022   Encounter for screening mammogram for malignant neoplasm of breast 08/28/2022   Screening for thyroid disorder 08/08/2022   Impaired mobility and endurance 08/08/2022   Screening for diabetes mellitus 08/08/2022   Elevated LDL cholesterol level 08/08/2022   Iron deficiency anemia secondary to  inadequate dietary iron intake 08/08/2022   Screening for colon cancer 08/08/2022   Morbid obesity (HCC) 08/08/2022   PCP:  Daved Eriksson, MD Pharmacy:   [facility] [id number] [place], [place] - [address] [place] [address] Phone: [phone] Fax: [phone]     Social Drivers of Health (SDOH) Social History: SDOH Screenings   Food Insecurity: No Food Insecurity (10/29/2023)  Housing: Low Risk  (10/29/2023)  Transportation Needs: No Transportation Needs (10/29/2023)  Utilities: Not At Risk (10/29/2023)  Alcohol Screen: Low Risk  (09/27/2022)  Depression (PHQ2-9): Medium Risk (09/27/2022)  Tobacco Use: Low Risk  (10/28/2023)   SDOH Interventions:     Readmission Risk Interventions     No data to display

## 2023-10-29 NOTE — ED Notes (Signed)
 Dr. Murrel Arnt notified about patients heart rate 130.

## 2023-10-29 NOTE — Progress Notes (Signed)
 PHARMACY - ANTICOAGULATION CONSULT NOTE  Pharmacy Consult for IV UFH Indication: pulmonary embolus  No Known Allergies  Patient Measurements: Height: 5\' 1"  (154.9 cm) Weight: 107.2 kg (236 lb 5.3 oz) IBW/kg (Calculated) : 47.8 HEPARIN DW (KG): 74  Vital Signs: Temp: 98.2 F (36.8 C) (05/06 1722) Temp Source: Oral (05/06 1621) BP: 119/78 (05/06 1800) Pulse Rate: 113 (05/06 1722)  Labs: Recent Labs    10/28/23 1516 10/28/23 1654 10/29/23 0144 10/29/23 0714 10/29/23 0718 10/29/23 1834  HGB 13.8  --  12.4 12.9  --   --   HCT 43.5  --  38.5 40.9  --   --   PLT 206  --  179 189  --   --   LABPROT  --   --   --   --  14.6  --   INR  --   --   --   --  1.1  --   HEPARINUNFRC  --   --  0.46  --  0.70 >1.10*  CREATININE 0.79  --   --  0.80  --   --   TROPONINIHS 70* 73*  --   --   --   --     Estimated Creatinine Clearance: 88.8 mL/min (by C-G formula based on SCr of 0.8 mg/dL).   Medical History: History reviewed. No pertinent past medical history.  Medications:  Scheduled:   amLODipine   5 mg Oral Daily   aspirin  325 mg Oral Daily   Infusions:   heparin 1,400 Units/hr (10/29/23 0732)    Assessment: Pt presented to ER with CP and shortness of breath found to have new PE with right heart strain. Pharmacy consulted to start IV heparin. Not on anticoagulation PTA.  Today, 10/29/23: Heparin level >1.10 - supratherapeutic on IV heparin 1400 units/hr I discussed with phlebotomist, who drew the heparin level from the opposite arm that heparin was running. Patient was a difficult stick, so RN removed the patient's L arm peripheral IV (nothing was running) in order for phlebotomist to use that access site. No s/sx of bleeding reported, but patient did have an aspiration thrombectomy done earlier this afternoon.  Hgb, pltc WNL Per IR pharmacy consult post-op, "continue heparin gtt for now, okay to transition to oral/Lovenox as per primary Hospitalist team."  Goal of  Therapy:  Heparin level 0.3-0.7 units/ml Monitor platelets by anticoagulation protocol: Yes   Plan:  Stop heparin x1 hour, then restart heparin gtt @1200  units/hr F/u heparin level 6hrs after rate change Daily CBC and heparin level while on heparin  F/U long-term anticoagulation plan   Roselee Cong, PharmD Clinical Pharmacist  5/6/20257:36 PM

## 2023-10-29 NOTE — ED Notes (Signed)
 Pt off BSC and back in bed. Pt hooked back up to cardiac monitor, resting in bed, family at bedside, pt in no distress and had no complaints. Treatment plan explained to pt.

## 2023-10-29 NOTE — Progress Notes (Signed)
 PHARMACY - ANTICOAGULATION CONSULT NOTE  Pharmacy Consult for IV UFH Indication: pulmonary embolus  No Known Allergies  Patient Measurements: Height: 5\' 1"  (154.9 cm) Weight: 107.2 kg (236 lb 5.3 oz) IBW/kg (Calculated) : 47.8 HEPARIN DW (KG): 74  Vital Signs: Temp: 98.1 F (36.7 C) (05/06 1100) Temp Source: Oral (05/06 0732) BP: 112/85 (05/06 1100) Pulse Rate: 122 (05/06 1100)  Labs: Recent Labs    10/28/23 1516 10/28/23 1654 10/29/23 0144 10/29/23 0714 10/29/23 0718  HGB 13.8  --  12.4 12.9  --   HCT 43.5  --  38.5 40.9  --   PLT 206  --  179 189  --   HEPARINUNFRC  --   --  0.46  --  0.70  CREATININE 0.79  --   --  0.80  --   TROPONINIHS 70* 73*  --   --   --     Estimated Creatinine Clearance: 88.8 mL/min (by C-G formula based on SCr of 0.8 mg/dL).   Medical History: History reviewed. No pertinent past medical history.  Medications:  Scheduled:   amLODipine   5 mg Oral Daily   aspirin  325 mg Oral Daily   potassium chloride  40 mEq Oral Q4H   Infusions:   heparin 1,400 Units/hr (10/29/23 0732)    Assessment: Pt presented to ER with CP and shortness of breath found to have new PE with right heart strain. Pharmacy consulted to start IV heparin. Not on anticoagulation PTA.  Heparin level 0.7 - therapeutic on IV heparin 1400 units/hr Hgb, pltc WNL No complications of therapy noted IR planning catheter directed thrombectomy  Goal of Therapy:  Heparin level 0.3-0.7 units/ml Monitor platelets by anticoagulation protocol: Yes   Plan:  Continue IV heparin at 1400 units/hr Daily CBC and heparin level while on heparin  Note plan for thrombectomy, will follow along for plan of care F/U long-term anticoagulation plan   Lolita Rise, PharmD, BCPS Clinical Pharmacist 10/29/2023 11:15 AM

## 2023-10-29 NOTE — ED Notes (Signed)
 Patient was still having left sided chest pain. Blood pressure was 118/102. Gave 1 ordered nitroglycerin PRN. Patient called RN to room stating she felt funny. Blood pressure rechecked at it dropped to 71/57. Dr. Murrel Arnt notified. 500mL bolus ordered.

## 2023-10-29 NOTE — Progress Notes (Signed)
 PHARMACY - ANTICOAGULATION CONSULT NOTE  Pharmacy Consult for IV UFH Indication: pulmonary embolus  No Known Allergies  Patient Measurements: Height: 5\' 1"  (154.9 cm) Weight: 107.2 kg (236 lb 5.3 oz) IBW/kg (Calculated) : 47.8 HEPARIN DW (KG): 74  Vital Signs: Temp: 98.2 F (36.8 C) (05/06 0218) Temp Source: Oral (05/06 0218) BP: 121/88 (05/06 0130) Pulse Rate: 123 (05/06 0130)  Labs: Recent Labs    10/28/23 1516 10/28/23 1654 10/29/23 0144  HGB 13.8  --  12.4  HCT 43.5  --  38.5  PLT 206  --  179  HEPARINUNFRC  --   --  0.46  CREATININE 0.79  --   --   TROPONINIHS 70* 73*  --     Estimated Creatinine Clearance: 88.8 mL/min (by C-G formula based on SCr of 0.79 mg/dL).   Medical History: History reviewed. No pertinent past medical history.  Medications:  Scheduled:   aspirin  325 mg Oral Daily   Infusions:   heparin 1,400 Units/hr (10/28/23 1958)    Assessment: Pt presented to ER with CP and shortness of breath found to have new PE with right heart strain. To start IV heparin. Not on anticoagulation PTA.  Initial heparin level 0.46- therapeutic on IV heparin 1400 units/hr Hg, pltc WNL No bleeding or infusion related concerns reported by RN  Goal of Therapy:  Heparin level 0.3-0.7 units/ml Monitor platelets by anticoagulation protocol: Yes   Plan:  Continue IV heparin at 1400 units/hr Recheck confirmatory heparin level at 8a Daily CBC and heparin level while on heparin  F/U long-term anticoagulation plan   Arie Kurtz PharmD 10/29/2023,2:27 AM

## 2023-10-29 NOTE — Procedures (Signed)
 Interventional Radiology Procedure Note  Procedure:  Pulmonary thrombectomy  Findings: Please refer to procedural dictation for full description. 24 Fr right CFV access pre-closed with Proglides.  Bilateral pulmonary artery thrombectomy with large acute and chronic appearing clot yield.  Mean main pulmonary artery pressure decreased from 34 mmHg to 23 mmHg after aspiration.    Complications: None immediate  Estimated Blood Loss: 100 mL  Recommendations: 1 hour bedrest. No head of bed restrictions. Continue anticoagulation. No perceived ICU needs from IR standpoint. Recommend obtaining formal echocardiogram and bilateral LE venous duplex. IR will follow.   Creasie Doctor, MD

## 2023-10-29 NOTE — ED Notes (Signed)
 Pt assisted to and from [facility]. Pt now resting back in bed. Call light within reach and bed locked and in lowest position.

## 2023-10-30 ENCOUNTER — Other Ambulatory Visit (HOSPITAL_COMMUNITY): Payer: Self-pay

## 2023-10-30 ENCOUNTER — Inpatient Hospital Stay (HOSPITAL_COMMUNITY)

## 2023-10-30 ENCOUNTER — Telehealth (HOSPITAL_COMMUNITY): Payer: Self-pay | Admitting: Pharmacy Technician

## 2023-10-30 DIAGNOSIS — Z86711 Personal history of pulmonary embolism: Secondary | ICD-10-CM

## 2023-10-30 DIAGNOSIS — I2609 Other pulmonary embolism with acute cor pulmonale: Secondary | ICD-10-CM

## 2023-10-30 LAB — BASIC METABOLIC PANEL WITH GFR
Anion gap: 8 (ref 5–15)
BUN: 12 mg/dL (ref 6–20)
CO2: 23 mmol/L (ref 22–32)
Calcium: 8.1 mg/dL — ABNORMAL LOW (ref 8.9–10.3)
Chloride: 102 mmol/L (ref 98–111)
Creatinine, Ser: 0.76 mg/dL (ref 0.44–1.00)
GFR, Estimated: >60 mL/min (ref >60)
Glucose, Bld: 108 mg/dL — ABNORMAL HIGH (ref 70–99)
Potassium: 3.2 mmol/L — ABNORMAL LOW (ref 3.5–5.1)
Sodium: 133 mmol/L — ABNORMAL LOW (ref 135–145)

## 2023-10-30 LAB — ECHOCARDIOGRAM COMPLETE
AR max vel: 2.01 cm2
AV Area VTI: 2.39 cm2
AV Area mean vel: 1.97 cm2
AV Mean grad: 3 mmHg
AV Peak grad: 7 mmHg
Ao pk vel: 1.32 m/s
Area-P 1/2: 6.48 cm2
Calc EF: 64.7 %
Height: 61 in
MV VTI: 2.84 cm2
S' Lateral: 2.7 cm
Single Plane A2C EF: 64.4 %
Single Plane A4C EF: 64 %
Weight: 3781.33 [oz_av]

## 2023-10-30 LAB — CBC
HCT: 36 % (ref 36.0–46.0)
Hemoglobin: 11.3 g/dL — ABNORMAL LOW (ref 12.0–15.0)
MCH: 29 pg (ref 26.0–34.0)
MCHC: 31.4 g/dL (ref 30.0–36.0)
MCV: 92.3 fL (ref 80.0–100.0)
Platelets: 179 10*3/uL (ref 150–400)
RBC: 3.9 MIL/uL (ref 3.87–5.11)
RDW: 13.6 % (ref 11.5–15.5)
WBC: 8.9 10*3/uL (ref 4.0–10.5)
nRBC: 0 % (ref 0.0–0.2)

## 2023-10-30 LAB — HEPARIN LEVEL (UNFRACTIONATED)
Heparin Unfractionated: 0.65 [IU]/mL (ref 0.30–0.70)
Heparin Unfractionated: 0.48 [IU]/mL (ref 0.30–0.70)

## 2023-10-30 MED ORDER — OXYCODONE HCL 5 MG PO TABS
5.0000 mg | ORAL_TABLET | ORAL | Status: DC | PRN
  Administered 2023-10-30: 5 mg via ORAL
  Filled 2023-10-30 (×2): qty 1

## 2023-10-30 NOTE — Progress Notes (Signed)
 PHARMACY - ANTICOAGULATION CONSULT NOTE  Pharmacy Consult for IV UFH Indication: pulmonary embolus  No Known Allergies  Patient Measurements: Height: 5\' 1"  (154.9 cm) Weight: 107.2 kg (236 lb 5.3 oz) IBW/kg (Calculated) : 47.8 HEPARIN DW (KG): 74  Vital Signs: Temp: 99.3 F (37.4 C) (05/07 0225) Temp Source: Oral (05/07 0225) BP: 124/67 (05/07 0225) Pulse Rate: 119 (05/07 0225)  Labs: Recent Labs    10/28/23 1516 10/28/23 1516 10/28/23 1654 10/29/23 0144 10/29/23 0714 10/29/23 0718 10/29/23 1834 10/30/23 0153  HGB 13.8  --   --  12.4 12.9  --   --  11.3*  HCT 43.5  --   --  38.5 40.9  --   --  36.0  PLT 206  --   --  179 189  --   --  179  LABPROT  --   --   --   --   --  14.6  --   --   INR  --   --   --   --   --  1.1  --   --   HEPARINUNFRC  --    < >  --  0.46  --  0.70 >1.10* 0.65  CREATININE 0.79  --   --   --  0.80  --   --   --   TROPONINIHS 70*  --  73*  --   --   --   --   --    < > = values in this interval not displayed.    Estimated Creatinine Clearance: 88.8 mL/min (by C-G formula based on SCr of 0.8 mg/dL).   Medical History: History reviewed. No pertinent past medical history.  Medications:  Scheduled:   amLODipine   5 mg Oral Daily   aspirin  325 mg Oral Daily   Infusions:   heparin 1,000 Units/hr (10/29/23 2000)    Assessment: Pt presented to ER with CP and shortness of breath found to have new PE with right heart strain. Pharmacy consulted to start IV heparin. Not on anticoagulation PTA. Significant Events: - 5/6: aspiration thrombectomy  Today, 10/30/23: Heparin level 0.65- therapeutic on IV heparin 1000 units/hr No s/sx of bleeding or infusion related concerns reported by RN  CBC: Hgb slightly low & trending down, pltc WNL  Goal of Therapy:  Heparin level 0.3-0.7 units/ml Monitor platelets by anticoagulation protocol: Yes   Plan:  Continue heparin gtt @1000  units/hr Check confirmatory heparin level at 0800 Daily CBC and  heparin level while on heparin  F/U long-term anticoagulation plan  Arie Kurtz, PharmD, BCPS 5/7/20252:50 AM

## 2023-10-30 NOTE — Hospital Course (Signed)
 57yo with h/o HTN, IDA, depression, and morbid obesity who presented on 5/5 with CP/SOB.  CTA with B PE with R heart strain.  Echo is pending.  Underwent thrombectomy on 5/6 by IR.

## 2023-10-30 NOTE — Progress Notes (Signed)
  Echocardiogram 2D Echocardiogram has been performed.  Kwinton Maahs L Gabriela Irigoyen RDCS 10/30/2023, 9:51 AM

## 2023-10-30 NOTE — Telephone Encounter (Signed)
 Patient Product/process development scientist completed.    The patient is insured through [facility] and [place] [employer].     Ran test claim for Eliquis 5 mg and the current 30 day co-pay is $0.00.  Ran test claim for Xarelto 20 mg and the current 30 day co-pay is $0.00.  This test claim was processed through [facility]- copay amounts may vary at other pharmacies due to pharmacy/plan contracts, or as the patient moves through the different stages of their insurance plan.     Morgan Arab, CPHT Pharmacy Technician III Certified Patient Advocate [facility] Patient Advocate Team Direct Number: [phone]  Fax: [phone]

## 2023-10-30 NOTE — Progress Notes (Signed)
 Progress Note   Patient: Erin Martin:[id number] DOB: 1966-03-27 DOA: 10/28/2023     2 DOS: the patient was seen and examined on 10/30/2023   Brief hospital course: 57yo with h/o HTN, IDA, depression, and morbid obesity who presented on 5/5 with CP/SOB.  CTA with B PE with R heart strain.  Echo is pending.  Underwent thrombectomy on 5/6 by IR.   Assessment and Plan:  Acute pulmonary emboli Patient without prior episodes of thromboembolic disease presenting with new PE She was having persistent CP and tachycardia with periodic hypotension and so IR was consulted for thrombectomy PESI score is Class V, very high risk, indicating a 10-24%% 30-day mortality risk S-PESI score is also high, indicating that the patient has an 8.9% risk of death and a 1.5% risk of recurrent VTE or non-fatal bleeding Admitted on telemetry to progressive care unit R heart strain was seen on CT; echo with grade 1 DD and otherwise unremarkable With high PESI/S-PESI score and R heart strain, she is at intermediate risk  Initiated anticoagulation with treatment-dose heparin with plan to transition to alternative [facility] agent tomorrow (Eliquis is fully covered by her insurance plan) Now s/p thrombectomy, which she tolerated well [place] O2 as needed Clot likely developed from immobility in the setting of hip pain (due for upcoming hip replacement, which will now need to be delayed for some time), placing her at intermediate risk (3-8%/year) for recurrent VTE.  Extended oral anticoagulation of indefinite duration should be considered for patients with a first episode of PE with these issues. The patient understands that thromboembolic disease can be catastrophic and even deadly and that she must be complaint with physician appointments and anticoagulation. DVT US  ordered and negative   Diffuse pain She has been complaining on flank pain spreading to back and abdomen since the thrombectomy was performed On exam today, it  sounded more pleuritic in nature Will change morphine to Oxy Anticipate dc to home 5/8  Essential hypertension Continue amlodipine     Morbid obesity Body mass index is 44.65 kg/m.Aaron Aas  Weight loss should be encouraged Outpatient PCP/bariatric medicine f/u encouraged Significantly low or high BMI is associated with higher medical risk including morbidity and mortality  She has been on Zepbound , which may be a contributing VTE factor although this is less likely than her chronic immobility and weight   Depression with anxiety No longer taking bupropion  or other medications   Iron deficiency anemia Hemoglobin is normal now Continue to monitor while on [facility]         Consultants: PCCM, telephone only IR   Procedures: Thrombectomy 5/6   Antibiotics: None  30 Day Unplanned Readmission Risk Score    Flowsheet Row ED to Hosp-Admission (Current) from 10/28/2023 in [place] 4TH FLOOR PROGRESSIVE CARE AND UROLOGY  30 Day Unplanned Readmission Risk Score (%) 8.88 Filed at 10/30/2023 0801       This score is the patient's risk of an unplanned readmission within 30 days of being discharged (0 -100%). The score is based on dignosis, age, lab data, medications, orders, and past utilization.   Low:  0-14.9   Medium: 15-21.9   High: 22-29.9   Extreme: 30 and above           Subjective: Reports ongoing chest/flank pain radiating into her back since after the procedure.   Objective: Vitals:   10/30/23 0619 10/30/23 0748  BP: 126/74 121/61  Pulse: (!) 110 (!) 111  Resp: 17 18  Temp: 99.6 F (  37.6 C) 98.3 F (36.8 C)  SpO2: 97% 98%    Intake/Output Summary (Last 24 hours) at 10/30/2023 0835 Last data filed at 10/30/2023 0700 Gross per 24 hour  Intake 1089.79 ml  Output --  Net 1089.79 ml   Filed Weights   10/28/23 1451  Weight: 107.2 kg    Exam:  General:  Appears calm and comfortable and is in NAD Eyes:   EOMI, normal lids, iris ENT:  grossly normal hearing, lips &  tongue, mmm Neck:  no LAD, masses or thyromegaly Cardiovascular:  RR with mild tachycardia to 110s. No LE edema.  Respiratory:   CTA bilaterally with no wheezes/rales/rhonchi.  Normal respiratory effort. Abdomen:  soft, NT, ND Skin:  no rash or induration seen on limited exam Musculoskeletal:  grossly normal tone BUE/BLE, good ROM, no bony abnormality Psychiatric:  blunted mood and affect, speech fluent and appropriate, AOx3 Neurologic:  CN 2-12 grossly intact, moves all extremities in coordinated fashion   Data Reviewed: I have reviewed the patient's lab results since admission.  Pertinent labs for today include:   BMP pending WBC 8.9 Hgb 11.3     Family Communication: None present  Disposition: Status is: Inpatient Remains inpatient appropriate because: ongoing monitoring     Time spent: 50 minutes  Unresulted Labs (From admission, onward)     Start     Ordered   10/30/23 0835  Basic metabolic panel  Once,   R        10/30/23 0834   10/30/23 0830  Heparin level (unfractionated)  Once-Timed,   TIMED        10/30/23 0307   10/29/23 0500  CBC  Daily,   R      10/28/23 1808             Author: Lorita Rosa, MD 10/30/2023 8:35 AM  For on call review [email or web address].

## 2023-10-30 NOTE — Progress Notes (Signed)
 Referring Physician(s): Mose Arena  Supervising Physician: Alyssa Jumper  Patient Status:  [name] - In-pt  Chief Complaint: Left-sided chest pain, cough, bilateral pulmonary emboli/ right heart strain; status post successful bilateral pulmonary artery thrombectomy 5/6   Subjective: Patient feeling better since PE thrombectomy yesterday.  She continues to have some mild left-sided chest pain but not as severe as yesterday.  Has occasional cough, no significant dyspnea, occasional back pain; O2 sats 98% on 2 L; heart rate slightly elevated   History reviewed. No pertinent past medical history. Past Surgical History:  Procedure Laterality Date   c-section     IR ANGIOGRAM PULMONARY BILATERAL SELECTIVE  10/29/2023   IR THROMBECT PRIM MECH ADD (INCLU) MOD SED  10/29/2023   IR THROMBECT PRIM MECH INIT (INCLU) MOD SED  10/29/2023   IR US  GUIDE VASC ACCESS RIGHT  10/29/2023   IR US  GUIDE VASC ACCESS RIGHT  10/29/2023      Allergies: Patient has no known allergies.  Medications: Prior to Admission medications   Medication Sig Start Date End Date Taking? Authorizing Provider  amLODipine  (NORVASC ) 5 MG tablet Take 1 tablet (5 mg total) by mouth daily. 08/28/22  Yes Normie Becton, FNP  tirzepatide  (ZEPBOUND ) 2.5 MG/0.5ML Pen Inject 2.5 mg into the skin once a week. Wednesdays 08/12/23  Yes [provider]     Vital Signs: BP 121/61 (BP Location: Right Arm)   Pulse (!) 111   Temp 98.3 F (36.8 C) (Oral)   Resp 18   Ht 5\' 1"  (1.549 m)   Wt 236 lb 5.3 oz (107.2 kg)   LMP 03/26/2022   SpO2 98%   BMI 44.65 kg/m   Physical Exam awake, alert.  Access site right common femoral vein soft, clean, dry, no hematoma, mildly tender  Imaging: VAS US  LOWER EXTREMITY VENOUS (DVT) Result Date: 10/30/2023  Lower Venous DVT Study Patient Name:  Erin Martin  Date of Exam:   10/30/2023 Medical Rec #: [id number]           Accession #:    [id number] Date of Birth: Jul 08, 1965          Patient  Gender: F Patient Age:   58 years Exam Location:  [facility] Procedure:      VAS US  LOWER EXTREMITY VENOUS (DVT) Referring Phys: Lorita Rosa --------------------------------------------------------------------------------  Indications: Pulmonary embolism.  Risk Factors: Confirmed PE past pregnancy and obesity. Anticoagulation: Heparin. Comparison Study: None. Performing Technologist: Estanislao Heimlich  Examination Guidelines: A complete evaluation includes B-mode imaging, spectral Doppler, color Doppler, and power Doppler as needed of all accessible portions of each vessel. Bilateral testing is considered an integral part of a complete examination. Limited examinations for reoccurring indications may be performed as noted. The reflux portion of the exam is performed with the patient in reverse Trendelenburg.  +---------+---------------+---------+-----------+----------+--------------+ RIGHT    CompressibilityPhasicitySpontaneityPropertiesThrombus Aging +---------+---------------+---------+-----------+----------+--------------+ CFV      Full           Yes      Yes                                 +---------+---------------+---------+-----------+----------+--------------+ SFJ      Full                                                        +---------+---------------+---------+-----------+----------+--------------+  FV Prox  Full                                                        +---------+---------------+---------+-----------+----------+--------------+ FV Mid   Full                                                        +---------+---------------+---------+-----------+----------+--------------+ FV DistalFull                                                        +---------+---------------+---------+-----------+----------+--------------+ PFV      Full                                                         +---------+---------------+---------+-----------+----------+--------------+ POP      Full           Yes      Yes                                 +---------+---------------+---------+-----------+----------+--------------+ PTV      Full                    Yes                                 +---------+---------------+---------+-----------+----------+--------------+ PERO     Full                    Yes                                 +---------+---------------+---------+-----------+----------+--------------+   +---------+---------------+---------+-----------+----------+--------------+ LEFT     CompressibilityPhasicitySpontaneityPropertiesThrombus Aging +---------+---------------+---------+-----------+----------+--------------+ CFV      Full           Yes      Yes                                 +---------+---------------+---------+-----------+----------+--------------+ SFJ      Full                                                        +---------+---------------+---------+-----------+----------+--------------+ FV Prox  Full                                                        +---------+---------------+---------+-----------+----------+--------------+  FV Mid   Full                                                        +---------+---------------+---------+-----------+----------+--------------+ FV DistalFull                                                        +---------+---------------+---------+-----------+----------+--------------+ PFV      Full                                                        +---------+---------------+---------+-----------+----------+--------------+ POP      Full           Yes      Yes                                 +---------+---------------+---------+-----------+----------+--------------+ PTV      Full                    Yes                                  +---------+---------------+---------+-----------+----------+--------------+ PERO     Full                    Yes                                 +---------+---------------+---------+-----------+----------+--------------+    Summary: BILATERAL: - No evidence of deep vein thrombosis seen in the lower extremities, bilaterally. -No evidence of popliteal cyst, bilaterally.   *See table(s) above for measurements and observations.    Preliminary    ECHOCARDIOGRAM COMPLETE Result Date: 10/30/2023    ECHOCARDIOGRAM REPORT   Patient Name:   Erin Martin Date of Exam: 10/30/2023 Medical Rec #:  [id number]          Height:       61.0 in Accession #:    [id number]         Weight:       236.3 lb Date of Birth:  June 18, 1966         BSA:          2.028 m Patient Age:    57 years           BP:           121/61 mmHg Patient Gender: F                  HR:           108 bpm. Exam Location:  Inpatient Procedure: 2D Echo, Cardiac Doppler and Color Doppler (Both Spectral and Color            Flow Doppler were utilized during procedure). Indications:    Pulmonary embolus  History:  Patient has no prior history of Echocardiogram examinations.                 Risk Factors:Hypertension.  Sonographer:    Juanita Shaw Referring Phys: [id number] MOHAMMAD L GARBA IMPRESSIONS  1. Left ventricular ejection fraction, by estimation, is 65 to 70%. The left ventricle has normal function. The left ventricle has no regional wall motion abnormalities. Left ventricular diastolic parameters are consistent with Grade I diastolic dysfunction (impaired relaxation).  2. Right ventricular systolic function is normal. The right ventricular size is normal.  3. The mitral valve is normal in structure. No evidence of mitral valve regurgitation. No evidence of mitral stenosis.  4. The aortic valve is normal in structure. Aortic valve regurgitation is not visualized. No aortic stenosis is present.  5. The inferior vena cava is normal in size with greater  than 50% respiratory variability, suggesting right atrial pressure of 3 mmHg. FINDINGS  Left Ventricle: Left ventricular ejection fraction, by estimation, is 65 to 70%. The left ventricle has normal function. The left ventricle has no regional wall motion abnormalities. The left ventricular internal cavity size was normal in size. There is  no left ventricular hypertrophy. Left ventricular diastolic parameters are consistent with Grade I diastolic dysfunction (impaired relaxation). Right Ventricle: The right ventricular size is normal. No increase in right ventricular wall thickness. Right ventricular systolic function is normal. Left Atrium: Left atrial size was normal in size. Right Atrium: Right atrial size was normal in size. Pericardium: There is no evidence of pericardial effusion. Mitral Valve: The mitral valve is normal in structure. No evidence of mitral valve regurgitation. No evidence of mitral valve stenosis. MV peak gradient, 2.3 mmHg. The mean mitral valve gradient is 1.0 mmHg. Tricuspid Valve: The tricuspid valve is normal in structure. Tricuspid valve regurgitation is not demonstrated. No evidence of tricuspid stenosis. Aortic Valve: The aortic valve is normal in structure. Aortic valve regurgitation is not visualized. No aortic stenosis is present. Aortic valve mean gradient measures 3.0 mmHg. Aortic valve peak gradient measures 7.0 mmHg. Aortic valve area, by VTI measures 2.39 cm. Pulmonic Valve: The pulmonic valve was normal in structure. Pulmonic valve regurgitation is not visualized. No evidence of pulmonic stenosis. Aorta: The aortic root is normal in size and structure. Venous: The inferior vena cava is normal in size with greater than 50% respiratory variability, suggesting right atrial pressure of 3 mmHg. IAS/Shunts: No atrial level shunt detected by color flow Doppler.  LEFT VENTRICLE PLAX 2D LVIDd:         4.00 cm     Diastology LVIDs:         2.70 cm     LV e' medial:    9.68 cm/s LV PW:          0.90 cm     LV E/e' medial:  4.9 LV IVS:        0.80 cm     LV e' lateral:   10.70 cm/s LVOT diam:     1.90 cm     LV E/e' lateral: 4.4 LV SV:         34 LV SV Index:   17 LVOT Area:     2.84 cm  LV Volumes (MOD) LV vol d, MOD A2C: 89.6 ml LV vol d, MOD A4C: 79.1 ml LV vol s, MOD A2C: 31.9 ml LV vol s, MOD A4C: 28.5 ml LV SV MOD A2C:     57.7 ml LV SV MOD A4C:  79.1 ml LV SV MOD BP:      55.9 ml RIGHT VENTRICLE RV Basal diam:  3.20 cm RV Mid diam:    3.00 cm RV S prime:     14.80 cm/s TAPSE (M-mode): 1.7 cm LEFT ATRIUM             Index        RIGHT ATRIUM          Index LA diam:        3.40 cm 1.68 cm/m   RA Area:     8.20 cm LA Vol (A2C):   16.9 ml 8.33 ml/m   RA Volume:   16.40 ml 8.09 ml/m LA Vol (A4C):   28.7 ml 14.15 ml/m LA Biplane Vol: 22.5 ml 11.10 ml/m  AORTIC VALVE                    PULMONIC VALVE AV Area (Vmax):    2.01 cm     PV Vmax:       0.86 m/s AV Area (Vmean):   1.97 cm     PV Peak grad:  3.0 mmHg AV Area (VTI):     2.39 cm AV Vmax:           132.00 cm/s AV Vmean:          76.600 cm/s AV VTI:            0.141 m AV Peak Grad:      7.0 mmHg AV Mean Grad:      3.0 mmHg LVOT Vmax:         93.80 cm/s LVOT Vmean:        53.200 cm/s LVOT VTI:          0.119 m LVOT/AV VTI ratio: 0.84  AORTA Ao Root diam: 2.70 cm Ao Asc diam:  3.20 cm MITRAL VALVE MV Area (PHT): 6.48 cm    SHUNTS MV Area VTI:   2.84 cm    Systemic VTI:  0.12 m MV Peak grad:  2.3 mmHg    Systemic Diam: 1.90 cm MV Mean grad:  1.0 mmHg MV Vmax:       0.76 m/s MV Vmean:      45.8 cm/s MV Decel Time: 117 msec MV E velocity: 47.20 cm/s MV A velocity: 62.80 cm/s MV E/A ratio:  0.75 Dorothye Gathers MD Electronically signed by Dorothye Gathers MD Signature Date/Time: 10/30/2023/2:52:15 PM    Final    IR US  Guide Vasc Access Right Result Date: 10/30/2023 INDICATION: 58 year old female with history of acute, intermediate-high risk pulmonary embolism. EXAM: 1. Ultrasound-guided vascular access of the right common femoral vein. 2.  Selective catheterization and angiography of the main and bilateral pulmonary arteries. 3. Bilateral pulmonary aspiration thrombectomy. 4. Pulmonary manometry. COMPARISON:  CTA chest from 10/28/2023 MEDICATIONS: 3,000 units heparin, intravenous ANESTHESIA/SEDATION: Moderate (conscious) sedation was employed during this procedure. A total of Versed 4 mg and Fentanyl 200 mcg was administered intravenously. Moderate Sedation Time: 65 minutes. The patient's level of consciousness and vital signs were monitored continuously by radiology nursing throughout the procedure under my direct supervision. FLUOROSCOPY TIME:  One hundred seventy-one mGy COMPLICATIONS: None immediate. TECHNIQUE: Informed written consent was obtained from the patient after a thorough discussion of the procedural risks, benefits and alternatives. All questions were addressed. Maximal Sterile Barrier Technique was utilized including caps, mask, sterile gowns, sterile gloves, sterile drape, hand hygiene and skin antiseptic. A timeout was performed prior to the initiation of the  procedure. Preprocedure ultrasound evaluation demonstrated patency of the right common femoral vein. The procedure was planned. Subdermal Local anesthesia was administered 1% lidocaine. A small skin nick was made. Under direct ultrasound visualization, the right common femoral vein was accessed with a 21 gauge micropuncture needle. A permanent ultrasound image was captured and stored in the record. Micropuncture sheath was inserted followed by placement of a Bentson wire was directed to the inferior vena cava under fluoroscopic guidance. Serial dilation was performed with an 8 [place] vascular sheath followed by placement of 2 ProGlides in a pre close fashion at the 10 o'clock and 2 o'clock positions. Over the wire, a 24 [place] Inari sheath was then placed and directed to the inferior vena cava. Over the wire, a double angle pigtail catheter was inserted and under fluoroscopic  guidance directed through the right atrium and right ventricle to the main pulmonary artery. A Rosen wire was then advanced to the right inferior lobar pulmonary artery. The pigtail catheter was exchanged for a 5 [place] select catheter which was directed into the right inferior lobar pulmonary artery. The wire was exchanged for a short taper superstiff Amplatz wire. The catheter was removed. A 24 [place] Flowtreiver aspiration catheter was then directed under fluoroscopic guidance to the main pulmonary artery. The inner dilator was removed. Pulmonary manometry was performed which was significant for a mean pulmonary artery pressure of 34 mm Hg. The aspiration catheter was then advanced to the right pulmonary artery. Multiple aspirations were performed which yielded a moderate volume of acute appearing thrombus. Right pulmonary angiogram was then repeated which demonstrated significantly improved patency and perfusion of the right lung. The coaxial system was then directed to the left pulmonary artery in the wire was inserted into the left inferior lobar pulmonary artery. Aspiration thrombectomy was then performed which yielded scant thrombus. Therefore, the T20 curved aspiration catheter was advanced in coaxial fashion and aspiration was performed which yielded large volume acute appearing thrombus. Repeat pulmonary manometry was then performed which demonstrated reduction in mean pulmonary artery pressure to 23 mm Hg. At this point, the patient's tachycardia improved and oxygen saturations improved. Left pulmonary angiogram demonstrated significantly improved perfusion throughout the left lung. The catheters were removed. The sheath was then removed and the ProGlides were tied and cut. IMPRESSION: 1. Successful bilateral pulmonary artery thrombectomy with removal of large volume acute appearing thrombus. 2. Pulmonary manometry demonstrated reduction in mean pulmonary artery pressure from 34 mm Hg to 23 mm Hg. Creasie Doctor, MD [facility] Radiology Electronically Signed   By: Creasie Doctor M.D.   On: 10/30/2023 08:32   IR Angiogram Pulmonary Bilateral Selective Result Date: 10/30/2023 INDICATION: 58 year old female with history of acute, intermediate-high risk pulmonary embolism. EXAM: 1. Ultrasound-guided vascular access of the right common femoral vein. 2. Selective catheterization and angiography of the main and bilateral pulmonary arteries. 3. Bilateral pulmonary aspiration thrombectomy. 4. Pulmonary manometry. COMPARISON:  CTA chest from 10/28/2023 MEDICATIONS: 3,000 units heparin, intravenous ANESTHESIA/SEDATION: Moderate (conscious) sedation was employed during this procedure. A total of Versed 4 mg and Fentanyl 200 mcg was administered intravenously. Moderate Sedation Time: 65 minutes. The patient's level of consciousness and vital signs were monitored continuously by radiology nursing throughout the procedure under my direct supervision. FLUOROSCOPY TIME:  One hundred seventy-one mGy COMPLICATIONS: None immediate. TECHNIQUE: Informed written consent was obtained from the patient after a thorough discussion of the procedural risks, benefits and alternatives. All questions were addressed. Maximal Sterile Barrier Technique was  utilized including caps, mask, sterile gowns, sterile gloves, sterile drape, hand hygiene and skin antiseptic. A timeout was performed prior to the initiation of the procedure. Preprocedure ultrasound evaluation demonstrated patency of the right common femoral vein. The procedure was planned. Subdermal Local anesthesia was administered 1% lidocaine. A small skin nick was made. Under direct ultrasound visualization, the right common femoral vein was accessed with a 21 gauge micropuncture needle. A permanent ultrasound image was captured and stored in the record. Micropuncture sheath was inserted followed by placement of a Bentson wire was directed to  the inferior vena cava under fluoroscopic guidance. Serial dilation was performed with an 8 [place] vascular sheath followed by placement of 2 ProGlides in a pre close fashion at the 10 o'clock and 2 o'clock positions. Over the wire, a 24 [place] Inari sheath was then placed and directed to the inferior vena cava. Over the wire, a double angle pigtail catheter was inserted and under fluoroscopic guidance directed through the right atrium and right ventricle to the main pulmonary artery. A Rosen wire was then advanced to the right inferior lobar pulmonary artery. The pigtail catheter was exchanged for a 5 [place] select catheter which was directed into the right inferior lobar pulmonary artery. The wire was exchanged for a short taper superstiff Amplatz wire. The catheter was removed. A 24 [place] Flowtreiver aspiration catheter was then directed under fluoroscopic guidance to the main pulmonary artery. The inner dilator was removed. Pulmonary manometry was performed which was significant for a mean pulmonary artery pressure of 34 mm Hg. The aspiration catheter was then advanced to the right pulmonary artery. Multiple aspirations were performed which yielded a moderate volume of acute appearing thrombus. Right pulmonary angiogram was then repeated which demonstrated significantly improved patency and perfusion of the right lung. The coaxial system was then directed to the left pulmonary artery in the wire was inserted into the left inferior lobar pulmonary artery. Aspiration thrombectomy was then performed which yielded scant thrombus. Therefore, the T20 curved aspiration catheter was advanced in coaxial fashion and aspiration was performed which yielded large volume acute appearing thrombus. Repeat pulmonary manometry was then performed which demonstrated reduction in mean pulmonary artery pressure to 23 mm Hg. At this point, the patient's tachycardia improved and oxygen saturations improved. Left pulmonary angiogram  demonstrated significantly improved perfusion throughout the left lung. The catheters were removed. The sheath was then removed and the ProGlides were tied and cut. IMPRESSION: 1. Successful bilateral pulmonary artery thrombectomy with removal of large volume acute appearing thrombus. 2. Pulmonary manometry demonstrated reduction in mean pulmonary artery pressure from 34 mm Hg to 23 mm Hg. Creasie Doctor, MD [facility] Radiology Electronically Signed   By: Creasie Doctor M.D.   On: 10/30/2023 08:32   IR US  Guide Vasc Access Right Result Date: 10/30/2023 INDICATION: 58 year old female with history of acute, intermediate-high risk pulmonary embolism. EXAM: 1. Ultrasound-guided vascular access of the right common femoral vein. 2. Selective catheterization and angiography of the main and bilateral pulmonary arteries. 3. Bilateral pulmonary aspiration thrombectomy. 4. Pulmonary manometry. COMPARISON:  CTA chest from 10/28/2023 MEDICATIONS: 3,000 units heparin, intravenous ANESTHESIA/SEDATION: Moderate (conscious) sedation was employed during this procedure. A total of Versed 4 mg and Fentanyl 200 mcg was administered intravenously. Moderate Sedation Time: 65 minutes. The patient's level of consciousness and vital signs were monitored continuously by radiology nursing throughout the procedure under my direct supervision. FLUOROSCOPY TIME:  One hundred seventy-one mGy COMPLICATIONS: None immediate. TECHNIQUE: Informed written  consent was obtained from the patient after a thorough discussion of the procedural risks, benefits and alternatives. All questions were addressed. Maximal Sterile Barrier Technique was utilized including caps, mask, sterile gowns, sterile gloves, sterile drape, hand hygiene and skin antiseptic. A timeout was performed prior to the initiation of the procedure. Preprocedure ultrasound evaluation demonstrated patency of the right common femoral vein. The  procedure was planned. Subdermal Local anesthesia was administered 1% lidocaine. A small skin nick was made. Under direct ultrasound visualization, the right common femoral vein was accessed with a 21 gauge micropuncture needle. A permanent ultrasound image was captured and stored in the record. Micropuncture sheath was inserted followed by placement of a Bentson wire was directed to the inferior vena cava under fluoroscopic guidance. Serial dilation was performed with an 8 [place] vascular sheath followed by placement of 2 ProGlides in a pre close fashion at the 10 o'clock and 2 o'clock positions. Over the wire, a 24 [place] Inari sheath was then placed and directed to the inferior vena cava. Over the wire, a double angle pigtail catheter was inserted and under fluoroscopic guidance directed through the right atrium and right ventricle to the main pulmonary artery. A Rosen wire was then advanced to the right inferior lobar pulmonary artery. The pigtail catheter was exchanged for a 5 [place] select catheter which was directed into the right inferior lobar pulmonary artery. The wire was exchanged for a short taper superstiff Amplatz wire. The catheter was removed. A 24 [place] Flowtreiver aspiration catheter was then directed under fluoroscopic guidance to the main pulmonary artery. The inner dilator was removed. Pulmonary manometry was performed which was significant for a mean pulmonary artery pressure of 34 mm Hg. The aspiration catheter was then advanced to the right pulmonary artery. Multiple aspirations were performed which yielded a moderate volume of acute appearing thrombus. Right pulmonary angiogram was then repeated which demonstrated significantly improved patency and perfusion of the right lung. The coaxial system was then directed to the left pulmonary artery in the wire was inserted into the left inferior lobar pulmonary artery. Aspiration thrombectomy was then performed which yielded scant thrombus.  Therefore, the T20 curved aspiration catheter was advanced in coaxial fashion and aspiration was performed which yielded large volume acute appearing thrombus. Repeat pulmonary manometry was then performed which demonstrated reduction in mean pulmonary artery pressure to 23 mm Hg. At this point, the patient's tachycardia improved and oxygen saturations improved. Left pulmonary angiogram demonstrated significantly improved perfusion throughout the left lung. The catheters were removed. The sheath was then removed and the ProGlides were tied and cut. IMPRESSION: 1. Successful bilateral pulmonary artery thrombectomy with removal of large volume acute appearing thrombus. 2. Pulmonary manometry demonstrated reduction in mean pulmonary artery pressure from 34 mm Hg to 23 mm Hg. Creasie Doctor, MD [facility] Radiology Electronically Signed   By: Creasie Doctor M.D.   On: 10/30/2023 08:32   IR THROMBECT PRIM MECH INIT (INCLU) MOD SED Result Date: 10/30/2023 INDICATION: 58 year old female with history of acute, intermediate-high risk pulmonary embolism. EXAM: 1. Ultrasound-guided vascular access of the right common femoral vein. 2. Selective catheterization and angiography of the main and bilateral pulmonary arteries. 3. Bilateral pulmonary aspiration thrombectomy. 4. Pulmonary manometry. COMPARISON:  CTA chest from 10/28/2023 MEDICATIONS: 3,000 units heparin, intravenous ANESTHESIA/SEDATION: Moderate (conscious) sedation was employed during this procedure. A total of Versed 4 mg and Fentanyl 200 mcg was administered intravenously. Moderate Sedation Time: 65 minutes. The patient's level of consciousness and  vital signs were monitored continuously by radiology nursing throughout the procedure under my direct supervision. FLUOROSCOPY TIME:  One hundred seventy-one mGy COMPLICATIONS: None immediate. TECHNIQUE: Informed written consent was obtained from the patient after a  thorough discussion of the procedural risks, benefits and alternatives. All questions were addressed. Maximal Sterile Barrier Technique was utilized including caps, mask, sterile gowns, sterile gloves, sterile drape, hand hygiene and skin antiseptic. A timeout was performed prior to the initiation of the procedure. Preprocedure ultrasound evaluation demonstrated patency of the right common femoral vein. The procedure was planned. Subdermal Local anesthesia was administered 1% lidocaine. A small skin nick was made. Under direct ultrasound visualization, the right common femoral vein was accessed with a 21 gauge micropuncture needle. A permanent ultrasound image was captured and stored in the record. Micropuncture sheath was inserted followed by placement of a Bentson wire was directed to the inferior vena cava under fluoroscopic guidance. Serial dilation was performed with an 8 [place] vascular sheath followed by placement of 2 ProGlides in a pre close fashion at the 10 o'clock and 2 o'clock positions. Over the wire, a 24 [place] Inari sheath was then placed and directed to the inferior vena cava. Over the wire, a double angle pigtail catheter was inserted and under fluoroscopic guidance directed through the right atrium and right ventricle to the main pulmonary artery. A Rosen wire was then advanced to the right inferior lobar pulmonary artery. The pigtail catheter was exchanged for a 5 [place] select catheter which was directed into the right inferior lobar pulmonary artery. The wire was exchanged for a short taper superstiff Amplatz wire. The catheter was removed. A 24 [place] Flowtreiver aspiration catheter was then directed under fluoroscopic guidance to the main pulmonary artery. The inner dilator was removed. Pulmonary manometry was performed which was significant for a mean pulmonary artery pressure of 34 mm Hg. The aspiration catheter was then advanced to the right pulmonary artery. Multiple aspirations were  performed which yielded a moderate volume of acute appearing thrombus. Right pulmonary angiogram was then repeated which demonstrated significantly improved patency and perfusion of the right lung. The coaxial system was then directed to the left pulmonary artery in the wire was inserted into the left inferior lobar pulmonary artery. Aspiration thrombectomy was then performed which yielded scant thrombus. Therefore, the T20 curved aspiration catheter was advanced in coaxial fashion and aspiration was performed which yielded large volume acute appearing thrombus. Repeat pulmonary manometry was then performed which demonstrated reduction in mean pulmonary artery pressure to 23 mm Hg. At this point, the patient's tachycardia improved and oxygen saturations improved. Left pulmonary angiogram demonstrated significantly improved perfusion throughout the left lung. The catheters were removed. The sheath was then removed and the ProGlides were tied and cut. IMPRESSION: 1. Successful bilateral pulmonary artery thrombectomy with removal of large volume acute appearing thrombus. 2. Pulmonary manometry demonstrated reduction in mean pulmonary artery pressure from 34 mm Hg to 23 mm Hg. Creasie Doctor, MD Vascular and Interventional Radiology Specialists [facility] Radiology Electronically Signed   By: Creasie Doctor M.D.   On: 10/30/2023 08:32   IR THROMBECT PRIM MECH ADD (INCLU) MOD SED Result Date: 10/30/2023 INDICATION: 58 year old female with history of acute, intermediate-high risk pulmonary embolism. EXAM: 1. Ultrasound-guided vascular access of the right common femoral vein. 2. Selective catheterization and angiography of the main and bilateral pulmonary arteries. 3. Bilateral pulmonary aspiration thrombectomy. 4. Pulmonary manometry. COMPARISON:  CTA chest from 10/28/2023 MEDICATIONS: 3,000 units heparin, intravenous ANESTHESIA/SEDATION: Moderate (conscious) sedation was  employed during this procedure. A total of Versed 4  mg and Fentanyl 200 mcg was administered intravenously. Moderate Sedation Time: 65 minutes. The patient's level of consciousness and vital signs were monitored continuously by radiology nursing throughout the procedure under my direct supervision. FLUOROSCOPY TIME:  One hundred seventy-one mGy COMPLICATIONS: None immediate. TECHNIQUE: Informed written consent was obtained from the patient after a thorough discussion of the procedural risks, benefits and alternatives. All questions were addressed. Maximal Sterile Barrier Technique was utilized including caps, mask, sterile gowns, sterile gloves, sterile drape, hand hygiene and skin antiseptic. A timeout was performed prior to the initiation of the procedure. Preprocedure ultrasound evaluation demonstrated patency of the right common femoral vein. The procedure was planned. Subdermal Local anesthesia was administered 1% lidocaine. A small skin nick was made. Under direct ultrasound visualization, the right common femoral vein was accessed with a 21 gauge micropuncture needle. A permanent ultrasound image was captured and stored in the record. Micropuncture sheath was inserted followed by placement of a Bentson wire was directed to the inferior vena cava under fluoroscopic guidance. Serial dilation was performed with an 8 [place] vascular sheath followed by placement of 2 ProGlides in a pre close fashion at the 10 o'clock and 2 o'clock positions. Over the wire, a 24 [place] Inari sheath was then placed and directed to the inferior vena cava. Over the wire, a double angle pigtail catheter was inserted and under fluoroscopic guidance directed through the right atrium and right ventricle to the main pulmonary artery. A Rosen wire was then advanced to the right inferior lobar pulmonary artery. The pigtail catheter was exchanged for a 5 [place] select catheter which was directed into the right inferior lobar pulmonary artery. The wire was exchanged for a short taper superstiff  Amplatz wire. The catheter was removed. A 24 [place] Flowtreiver aspiration catheter was then directed under fluoroscopic guidance to the main pulmonary artery. The inner dilator was removed. Pulmonary manometry was performed which was significant for a mean pulmonary artery pressure of 34 mm Hg. The aspiration catheter was then advanced to the right pulmonary artery. Multiple aspirations were performed which yielded a moderate volume of acute appearing thrombus. Right pulmonary angiogram was then repeated which demonstrated significantly improved patency and perfusion of the right lung. The coaxial system was then directed to the left pulmonary artery in the wire was inserted into the left inferior lobar pulmonary artery. Aspiration thrombectomy was then performed which yielded scant thrombus. Therefore, the T20 curved aspiration catheter was advanced in coaxial fashion and aspiration was performed which yielded large volume acute appearing thrombus. Repeat pulmonary manometry was then performed which demonstrated reduction in mean pulmonary artery pressure to 23 mm Hg. At this point, the patient's tachycardia improved and oxygen saturations improved. Left pulmonary angiogram demonstrated significantly improved perfusion throughout the left lung. The catheters were removed. The sheath was then removed and the ProGlides were tied and cut. IMPRESSION: 1. Successful bilateral pulmonary artery thrombectomy with removal of large volume acute appearing thrombus. 2. Pulmonary manometry demonstrated reduction in mean pulmonary artery pressure from 34 mm Hg to 23 mm Hg. Creasie Doctor, MD Vascular and Interventional Radiology Specialists [facility] Radiology Electronically Signed   By: Creasie Doctor M.D.   On: 10/30/2023 08:32   CT Angio Chest PE W and/or Wo Contrast Result Date: 10/28/2023 CLINICAL DATA:  Concern for pulmonary embolism. EXAM: CT ANGIOGRAPHY CHEST WITH CONTRAST TECHNIQUE: Multidetector CT imaging of the  chest was performed using the standard protocol during bolus administration  of intravenous contrast. Multiplanar CT image reconstructions and MIPs were obtained to evaluate the vascular anatomy. RADIATION DOSE REDUCTION: This exam was performed according to the departmental dose-optimization program which includes automated exposure control, adjustment of the mA and/or kV according to patient size and/or use of iterative reconstruction technique. CONTRAST:  75mL OMNIPAQUE IOHEXOL 350 MG/ML SOLN COMPARISON:  Chest radiograph dated 10/28/2023. FINDINGS: Cardiovascular: There is no cardiomegaly or pericardial effusion. The thoracic aorta is unremarkable. The origins of the great vessels of the aortic arch appear patent. Large bilateral central pulmonary artery emboli extending into the lobar branches of the upper and lower lobes. There is a right heart straining with RV/LV ratio of approximately 2. Mediastinum/Nodes: No hilar or mediastinal adenopathy. The esophagus is grossly unremarkable. No mediastinal fluid collection. Lungs/Pleura: Several peripheral nodular densities primarily involving the lung bases measure up to 7 mm in the left lung base. There is a tiny amount of air within a pulmonary nodule at the right lung base (72/13). Findings may represent septic emboli or atypical infection such as fungal infection versus areas of pulmonary infarct. No pleural effusion or pneumothorax. The central airways are patent. Upper Abdomen: No acute abnormality. Musculoskeletal: No acute osseous pathology. Review of the MIP images confirms the above findings. IMPRESSION: 1. Positive for acute PE with CT evidence of right heart strain (RV/LV Ratio = 2.0) consistent with at least submassive (intermediate risk) PE. The presence of right heart strain has been associated with an increased risk of morbidity and mortality. Please refer to the "Code PE Focused" order set in EPIC. 2. Several peripheral nodular densities primarily  involving the lung bases may represent septic emboli or fungal infection versus areas of pulmonary infarct. These results were called by telephone at the time of interpretation on 10/28/2023 at 5:47 pm to provider Paris Bolds , who verbally acknowledged these results. Electronically Signed   By: Angus Bark M.D.   On: 10/28/2023 17:50   DG Chest 2 View Result Date: 10/28/2023 CLINICAL DATA:  Chest pain. EXAM: CHEST - 2 VIEW COMPARISON:  None Available. FINDINGS: No focal consolidation, pleural effusion or pneumothorax. The cardiac silhouette is within limits. No acute osseous pathology. IMPRESSION: No active cardiopulmonary disease. Electronically Signed   By: Angus Bark M.D.   On: 10/28/2023 16:13    Labs:  CBC: Recent Labs    10/28/23 1516 10/29/23 0144 10/29/23 0714 10/30/23 0153  WBC 5.9 6.8 7.1 8.9  HGB 13.8 12.4 12.9 11.3*  HCT 43.5 38.5 40.9 36.0  PLT 206 179 189 179    COAGS: Recent Labs    10/29/23 0718  INR 1.1    BMP: Recent Labs    10/28/23 1516 10/29/23 0714 10/30/23 0857  NA 141 140 133*  K 3.1* 2.8* 3.2*  CL 108 104 102  CO2 21* 25 23  GLUCOSE 107* 113* 108*  BUN 13 13 12   CALCIUM 9.0 8.9 8.1*  CREATININE 0.79 0.80 0.76  GFRNONAA >60 >60 >60    LIVER FUNCTION TESTS: Recent Labs    10/29/23 0714  BILITOT 1.3*  AST 19  ALT 12  ALKPHOS 53  PROT 7.5  ALBUMIN 3.4*    Assessment and Plan: Patient with history of acute intermediate high risk bilateral pulmonary emboli; status post successful bilateral pulmonary artery thrombectomy on 5/6; afebrile, BP okay, heart rate slightly tachycardic; WBC normal, hemoglobin 11.3, creatinine normal, potassium 3.2; remains on IV heparin; bilateral lower extremity venous Doppler study negative; echo with EF of 65 to 70%,  normal right ventricular systolic function, normal right ventricular size, grade 1 diastolic dysfunction; further plans as per TR[facility]   Electronically Signed: D. Honore Lux,  PA-C 10/30/2023, 4:17 PM   I spent a total of 15 Minutes at the the patient's bedside AND on the patient's hospital floor or unit, greater than 50% of which was counseling/coordinating care for bilateral pulmonary artery thrombectomy    Patient ID: Erin Martin, female   DOB: [birth year], 58 y.o.   MRN: [id number]

## 2023-10-30 NOTE — Progress Notes (Signed)
 Lower extremity venous duplex completed. Please see CV Procedures for preliminary results.  Estanislao Heimlich, RVT 10/30/23 3:14 PM

## 2023-10-30 NOTE — Progress Notes (Signed)
 PHARMACY - ANTICOAGULATION CONSULT NOTE  Pharmacy Consult for IV UFH Indication: pulmonary embolus  No Known Allergies  Patient Measurements: Height: 5\' 1"  (154.9 cm) Weight: 107.2 kg (236 lb 5.3 oz) IBW/kg (Calculated) : 47.8 HEPARIN DW (KG): 74  Vital Signs: Temp: 98.3 F (36.8 C) (05/07 0748) Temp Source: Oral (05/07 0748) BP: 121/61 (05/07 0748) Pulse Rate: 111 (05/07 0748)  Labs: Recent Labs    10/28/23 1516 10/28/23 1516 10/28/23 1654 10/29/23 0144 10/29/23 0714 10/29/23 0718 10/29/23 1834 10/30/23 0153 10/30/23 0857  HGB 13.8  --   --  12.4 12.9  --   --  11.3*  --   HCT 43.5  --   --  38.5 40.9  --   --  36.0  --   PLT 206  --   --  179 189  --   --  179  --   LABPROT  --   --   --   --   --  14.6  --   --   --   INR  --   --   --   --   --  1.1  --   --   --   HEPARINUNFRC  --    < >  --  0.46  --  0.70 >1.10* 0.65 0.48  CREATININE 0.79  --   --   --  0.80  --   --   --   --   TROPONINIHS 70*  --  73*  --   --   --   --   --   --    < > = values in this interval not displayed.    Estimated Creatinine Clearance: 88.8 mL/min (by C-G formula based on SCr of 0.8 mg/dL).   Medical History: History reviewed. No pertinent past medical history.  Medications:  Scheduled:   amLODipine   5 mg Oral Daily   aspirin  325 mg Oral Daily   Infusions:   heparin 1,000 Units/hr (10/29/23 2000)    Assessment: Pt presented to ER with CP and shortness of breath found to have new PE with right heart strain. Pharmacy consulted to start IV heparin. Not on anticoagulation PTA.  Significant Events: 5/6 aspiration thrombectomy  Today, 10/30/23: Heparin level 0.48 - therapeutic on heparin 1000 units/hr CBC stable No complications of therapy noted  Goal of Therapy:  Heparin level 0.3-0.7 units/ml Monitor platelets by anticoagulation protocol: Yes   Plan:  Continue heparin infusion at 1000 units/hr Daily CBC and heparin level while on heparin  F/U long-term  anticoagulation plan  Lolita Rise, PharmD, BCPS Clinical Pharmacist 10/30/2023 9:55 AM

## 2023-10-31 DIAGNOSIS — I2609 Other pulmonary embolism with acute cor pulmonale: Secondary | ICD-10-CM | POA: Diagnosis not present

## 2023-10-31 LAB — CBC
HCT: 31.1 % — ABNORMAL LOW (ref 36.0–46.0)
Hemoglobin: 9.5 g/dL — ABNORMAL LOW (ref 12.0–15.0)
MCH: 29 pg (ref 26.0–34.0)
MCHC: 30.5 g/dL (ref 30.0–36.0)
MCV: 94.8 fL (ref 80.0–100.0)
Platelets: 142 10*3/uL — ABNORMAL LOW (ref 150–400)
RBC: 3.28 MIL/uL — ABNORMAL LOW (ref 3.87–5.11)
RDW: 13.2 % (ref 11.5–15.5)
WBC: 6.6 10*3/uL (ref 4.0–10.5)
nRBC: 0 % (ref 0.0–0.2)

## 2023-10-31 LAB — HEPARIN LEVEL (UNFRACTIONATED): Heparin Unfractionated: 0.23 [IU]/mL — ABNORMAL LOW (ref 0.30–0.70)

## 2023-10-31 MED ORDER — APIXABAN 5 MG PO TABS
10.0000 mg | ORAL_TABLET | Freq: Two times a day (BID) | ORAL | Status: DC
  Administered 2023-10-31: 10 mg via ORAL
  Filled 2023-10-31: qty 2

## 2023-10-31 MED ORDER — APIXABAN 5 MG PO TABS: 5.0000 mg | ORAL_TABLET | Freq: Two times a day (BID) | ORAL | Status: DC

## 2023-10-31 MED ORDER — OXYCODONE HCL 5 MG PO TABS: 5.0000 mg | ORAL_TABLET | ORAL | 0 refills | Status: AC | PRN

## 2023-10-31 MED ORDER — APIXABAN 5 MG PO TABS: ORAL_TABLET | ORAL | 0 refills | Status: AC

## 2023-10-31 NOTE — TOC Transition Note (Signed)
 Transition of Care [facility]) - Discharge Note   Patient Details  Name: Erin Martin MRN: [id number] Date of Birth: 13-Apr-1966  Transition of Care [facility] CM/SW Contact:  Ruben Corolla, RN Phone Number: 10/31/2023, 1:19 PM   Clinical Narrative: d/c home.      Final next level of care: Home/Self Care Barriers to Discharge: No Barriers Identified   Patient Goals and CMS Choice Patient states their goals for this hospitalization and ongoing recovery are:: home   Choice offered to / list presented to : Patient [place] ownership interest in [facility].provided to:: Patient    Discharge Placement                       Discharge Plan and Services Additional resources added to the After Visit Summary for                                       Social Drivers of Health (SDOH) Interventions SDOH Screenings   Food Insecurity: No Food Insecurity (10/29/2023)  Housing: Low Risk  (10/29/2023)  Transportation Needs: No Transportation Needs (10/29/2023)  Utilities: Not At Risk (10/29/2023)  Alcohol Screen: Low Risk  (09/27/2022)  Depression (PHQ2-9): Medium Risk (09/27/2022)  Tobacco Use: Low Risk  (10/28/2023)     Readmission Risk Interventions     No data to display

## 2023-10-31 NOTE — Plan of Care (Signed)

## 2023-10-31 NOTE — Discharge Instructions (Addendum)
 Information on my medicine - ELIQUIS (apixaban)  Why was Eliquis prescribed for you? Eliquis was prescribed to treat blood clots that may have been found in the veins of your legs (deep vein thrombosis) or in your lungs (pulmonary embolism) and to reduce the risk of them occurring again.  What do You need to know about Eliquis ? The starting dose is 10 mg (two 5 mg tablets) taken TWICE daily for the FIRST SEVEN (7) DAYS, then on 11/07/23 the dose is reduced to ONE 5 mg tablet taken TWICE daily.  Eliquis may be taken with or without food.   Try to take the dose about the same time in the morning and in the evening. If you have difficulty swallowing the tablet whole please discuss with your pharmacist how to take the medication safely.  Take Eliquis exactly as prescribed and DO NOT stop taking Eliquis without talking to the doctor who prescribed the medication.  Stopping may increase your risk of developing a new blood clot.  Refill your prescription before you run out.  After discharge, you should have regular check-up appointments with your healthcare provider that is prescribing your Eliquis.    What do you do if you miss a dose? If a dose of ELIQUIS is not taken at the scheduled time, take it as soon as possible on the same day and twice-daily administration should be resumed. The dose should not be doubled to make up for a missed dose.  Important Safety Information A possible side effect of Eliquis is bleeding. You should call your healthcare provider right away if you experience any of the following: Bleeding from an injury or your nose that does not stop. Unusual colored urine (red or dark brown) or unusual colored stools (red or black). Unusual bruising for unknown reasons. A serious fall or if you hit your head (even if there is no bleeding).  Some medicines may interact with Eliquis and might increase your risk of bleeding or clotting while on Eliquis. To help avoid this,  consult your healthcare provider or pharmacist prior to using any new prescription or non-prescription medications, including herbals, vitamins, non-steroidal anti-inflammatory drugs (NSAIDs) and supplements.  This website has more information on Eliquis (apixaban): http://[email or web address].com/eliquis/home

## 2023-10-31 NOTE — Progress Notes (Signed)
 PHARMACY - ANTICOAGULATION CONSULT NOTE  Pharmacy Consult for IV UFH Indication: pulmonary embolus  No Known Allergies  Patient Measurements: Height: 5\' 1"  (154.9 cm) Weight: 107.2 kg (236 lb 5.3 oz) IBW/kg (Calculated) : 47.8 HEPARIN DW (KG): 74  Vital Signs: Temp: 98.6 F (37 C) (05/08 0823) Temp Source: Oral (05/08 0823) BP: 116/66 (05/08 0829) Pulse Rate: 113 (05/08 0823)  Labs: Recent Labs    10/28/23 1516 10/28/23 1654 10/29/23 0144 10/29/23 4098 10/29/23 0718 10/29/23 1834 10/30/23 0153 10/30/23 0857 10/31/23 0520  HGB 13.8  --    < > 12.9  --   --  11.3*  --  9.5*  HCT 43.5  --    < > 40.9  --   --  36.0  --  31.1*  PLT 206  --    < > 189  --   --  179  --  142*  LABPROT  --   --   --   --  14.6  --   --   --   --   INR  --   --   --   --  1.1  --   --   --   --   HEPARINUNFRC  --   --    < >  --  0.70   < > 0.65 0.48 0.23*  CREATININE 0.79  --   --  0.80  --   --   --  0.76  --   TROPONINIHS 70* 73*  --   --   --   --   --   --   --    < > = values in this interval not displayed.    Estimated Creatinine Clearance: 88.8 mL/min (by C-G formula based on SCr of 0.76 mg/dL).   Medical History: History reviewed. No pertinent past medical history.   Assessment: Pt presented to ER with CP and shortness of breath found to have new PE with right heart strain. Pharmacy consulted to start IV heparin. Not on anticoagulation PTA.  Significant Events: 5/6 aspiration thrombectomy  Discussed with MD. Will transition to Eliquis in anticipation of discharge.   Plan:  Stop heparin with first dose of Eliquis Eliquis 10 mg BID x 7 days followed by 5 mg BID Pharmacy to provide education today  Lolita Rise, PharmD, BCPS Clinical Pharmacist 10/31/2023 9:56 AM

## 2023-10-31 NOTE — Progress Notes (Signed)
 PHARMACY - ANTICOAGULATION CONSULT NOTE  Pharmacy Consult for IV UFH Indication: pulmonary embolus  No Known Allergies  Patient Measurements: Height: 5\' 1"  (154.9 cm) Weight: 107.2 kg (236 lb 5.3 oz) IBW/kg (Calculated) : 47.8 HEPARIN DW (KG): 74  Vital Signs: Temp: 98.5 F (36.9 C) (05/08 0539) Temp Source: Oral (05/08 0539) BP: 114/71 (05/08 0539) Pulse Rate: 107 (05/08 0539)  Labs: Recent Labs    10/28/23 1516 10/28/23 1654 10/29/23 0144 10/29/23 0714 10/29/23 0718 10/29/23 1834 10/30/23 0153 10/30/23 0857 10/31/23 0520  HGB 13.8  --    < > 12.9  --   --  11.3*  --  9.5*  HCT 43.5  --    < > 40.9  --   --  36.0  --  31.1*  PLT 206  --    < > 189  --   --  179  --  142*  LABPROT  --   --   --   --  14.6  --   --   --   --   INR  --   --   --   --  1.1  --   --   --   --   HEPARINUNFRC  --   --    < >  --  0.70   < > 0.65 0.48 0.23*  CREATININE 0.79  --   --  0.80  --   --   --  0.76  --   TROPONINIHS 70* 73*  --   --   --   --   --   --   --    < > = values in this interval not displayed.    Estimated Creatinine Clearance: 88.8 mL/min (by C-G formula based on SCr of 0.76 mg/dL).   Medical History: History reviewed. No pertinent past medical history.  Medications:  Scheduled:   amLODipine   5 mg Oral Daily   aspirin  325 mg Oral Daily   Infusions:   heparin 1,000 Units/hr (10/30/23 1434)    Assessment: Pt presented to ER with CP and shortness of breath found to have new PE with right heart strain. Pharmacy consulted to start IV heparin. Not on anticoagulation PTA.  Significant Events: 5/6 aspiration thrombectomy  Today, 10/31/23: Heparin level 0.23 - now subtherapeutic on heparin 1000 units/hr CBC- Hg 9.5, pltc 142 -->low/decreased  No bleeding or infusion related concerns reported by RN  Goal of Therapy:  Heparin level 0.3-0.7 units/ml Monitor platelets by anticoagulation protocol: Yes   Plan:  Increase heparin infusion to 1200  units/hr Recheck heparin level 6h after rate change Daily CBC and heparin level while on heparin  F/U long-term anticoagulation plan >>noted plans to switch to Eliquis today  Arie Kurtz, PharmD, BCPS Clinical Pharmacist 10/31/2023 5:55 AM

## 2023-10-31 NOTE — Discharge Summary (Signed)
 Physician Discharge Summary   Patient: Erin Martin MRN: [id number] DOB: March 28, 1966  Admit date:     10/28/2023  Discharge date: 10/31/23  Discharge Physician: Lorita Rosa   PCP: Daved Eriksson, MD   Recommendations at discharge:   You have a significant blood clot and will need to take Eliquis twice daily indefinitely You are being prescribed a limited number of narcotic pain pills; take as directed and do not drive or make important decisions while taking this medication  Hold Zepbound  for now, although this may be able to be resumed in the future Follow up with Dr. Starr Eddy in 1-2 weeks for hospital follow up and recheck CBC  Discharge Diagnoses: Principal Problem:   Acute pulmonary embolism with acute cor pulmonale (HCC) Active Problems:   Iron deficiency anemia secondary to inadequate dietary iron intake   Morbid obesity (HCC)   Primary hypertension   Mood disturbance    Hospital Course: 57yo with h/o HTN, IDA, depression, and morbid obesity who presented on 5/5 with CP/SOB.  CTA with B PE with R heart strain.  Echo is pending.  Underwent thrombectomy on 5/6 by IR.   Assessment and Plan:  Acute pulmonary emboli Patient without prior episodes of thromboembolic disease presenting with new PE She was having persistent CP and tachycardia with periodic hypotension and so IR was consulted for thrombectomy R heart strain was seen on CT; echo with grade 1 DD and otherwise unremarkable Initiated anticoagulation with treatment-dose heparin with plan to transition to alternative AC agent tomorrow (Eliquis is fully covered by her insurance plan) Now s/p thrombectomy, which she tolerated well Clot likely developed from immobility in the setting of hip pain (due for upcoming hip replacement, which will now need to be delayed for some time), placing her at intermediate risk (3-8%/year) for recurrent VTE.  Extended oral anticoagulation of indefinite duration should be considered  for patients with a first episode of PE with these issues. The patient understands that thromboembolic disease can be catastrophic and even deadly and that she must be complaint with physician appointments and anticoagulation. DVT US  ordered and negative   Diffuse pain She has been complaining on flank pain spreading to back and abdomen since the thrombectomy was performed Pain has mostly resolved DC with a limited quantity of oxy for pain control   Essential hypertension Continue amlodipine     Morbid obesity Body mass index is 44.65 kg/m.Aaron Aas  Weight loss should be encouraged Outpatient PCP/bariatric medicine f/u encouraged Significantly low or high BMI is associated with higher medical risk including morbidity and mortality  She has been on Zepbound , which may be a contributing VTE factor although this is less likely than her chronic immobility and weight   Depression with anxiety No longer taking bupropion  or other medications   Iron deficiency anemia Hemoglobin was normal on presentation but is slightly decreased today No evidence of bleeding Continue to monitor while on [facility]         Consultants: PCCM, telephone only IR   Procedures: Thrombectomy 5/6   Antibiotics: None  Pain control - [place]  Controlled Substance Reporting System database was reviewed. and patient was instructed, not to drive, operate heavy machinery, perform activities at heights, swimming or participation in water activities or provide baby-sitting services while on Pain, Sleep and Anxiety Medications; until their outpatient Physician has advised to do so again. Also recommended to not to take more than prescribed Pain, Sleep and Anxiety Medications.    Disposition: Home Diet recommendation:  Regular diet DISCHARGE MEDICATION: Allergies as of 10/31/2023   No Known Allergies      Medication List     PAUSE taking these medications    Zepbound  2.5 MG/0.5ML Pen Wait to take this until  your doctor or other care provider tells you to start again. Generic drug: tirzepatide  Inject 2.5 mg into the skin once a week. Wednesdays       TAKE these medications    amLODipine  5 MG tablet Commonly known as: Norvasc  Take 1 tablet (5 mg total) by mouth daily.   apixaban 5 MG Tabs tablet Commonly known as: ELIQUIS Take 2 tablets (10 mg total) by mouth 2 (two) times daily for 7 days, THEN 1 tablet (5 mg total) 2 (two) times daily. Start taking on: Oct 31, 2023   oxyCODONE 5 MG immediate release tablet Commonly known as: Oxy IR/ROXICODONE Take 1 tablet (5 mg total) by mouth every 4 (four) hours as needed for moderate pain (pain score 4-6) or severe pain (pain score 7-10).        Discharge Exam:   Subjective: Feeling better. Pain is better.  Feels ready for dc.   Objective: Vitals:   10/31/23 0827 10/31/23 0829  BP: 122/85 116/66  Pulse:    Resp:    Temp:    SpO2:      Intake/Output Summary (Last 24 hours) at 10/31/2023 1300 Last data filed at 10/31/2023 0800 Gross per 24 hour  Intake 480 ml  Output --  Net 480 ml   Filed Weights   10/28/23 1451  Weight: 107.2 kg    Exam:  General:  Appears calm and comfortable and is in NAD, on RA Eyes:   EOMI, normal lids, iris ENT:  grossly normal hearing, lips & tongue, mmm Neck:  no LAD, masses or thyromegaly Cardiovascular:  RR with mild tachycardia to 110s. No LE edema.  Respiratory:   CTA bilaterally with no wheezes/rales/rhonchi.  Normal respiratory effort. Abdomen:  soft, NT, ND Skin:  no rash or induration seen on limited exam Musculoskeletal:  grossly normal tone BUE/BLE, good ROM, no bony abnormality Psychiatric:  blunted mood and affect, speech fluent and appropriate, AOx3 Neurologic:  CN 2-12 grossly intact, moves all extremities in coordinated fashion  Data Reviewed: I have reviewed the patient's lab results since admission.  Pertinent labs for today include:  WBC 6.6 Hgb 9.5, 12.9 on 5/6 Platelets  142    Condition at discharge: stable  The results of significant diagnostics from this hospitalization (including imaging, microbiology, ancillary and laboratory) are listed below for reference.   Imaging Studies: VAS US  LOWER EXTREMITY VENOUS (DVT) Result Date: 10/30/2023  Lower Venous DVT Study Patient Name:  Erin Martin  Date of Exam:   10/30/2023 Medical Rec #: [id number]           Accession #:    [id number] Date of Birth: [birth year]          Patient Gender: F Patient Age:   24 years Exam Location:  [facility] Procedure:      VAS US  LOWER EXTREMITY VENOUS (DVT) Referring Phys: Lorita Rosa --------------------------------------------------------------------------------  Indications: Pulmonary embolism.  Risk Factors: Confirmed PE past pregnancy and obesity. Anticoagulation: Heparin. Comparison Study: None. Performing Technologist: Estanislao Heimlich  Examination Guidelines: A complete evaluation includes B-mode imaging, spectral Doppler, color Doppler, and power Doppler as needed of all accessible portions of each vessel. Bilateral testing is considered an integral part of a complete examination. Limited examinations for reoccurring indications may  be performed as noted. The reflux portion of the exam is performed with the patient in reverse Trendelenburg.  +---------+---------------+---------+-----------+----------+--------------+ RIGHT    CompressibilityPhasicitySpontaneityPropertiesThrombus Aging +---------+---------------+---------+-----------+----------+--------------+ CFV      Full           Yes      Yes                                 +---------+---------------+---------+-----------+----------+--------------+ SFJ      Full                                                        +---------+---------------+---------+-----------+----------+--------------+ FV Prox  Full                                                         +---------+---------------+---------+-----------+----------+--------------+ FV Mid   Full                                                        +---------+---------------+---------+-----------+----------+--------------+ FV DistalFull                                                        +---------+---------------+---------+-----------+----------+--------------+ PFV      Full                                                        +---------+---------------+---------+-----------+----------+--------------+ POP      Full           Yes      Yes                                 +---------+---------------+---------+-----------+----------+--------------+ PTV      Full                    Yes                                 +---------+---------------+---------+-----------+----------+--------------+ PERO     Full                    Yes                                 +---------+---------------+---------+-----------+----------+--------------+   +---------+---------------+---------+-----------+----------+--------------+ LEFT     CompressibilityPhasicitySpontaneityPropertiesThrombus Aging +---------+---------------+---------+-----------+----------+--------------+ CFV      Full           Yes  Yes                                 +---------+---------------+---------+-----------+----------+--------------+ SFJ      Full                                                        +---------+---------------+---------+-----------+----------+--------------+ FV Prox  Full                                                        +---------+---------------+---------+-----------+----------+--------------+ FV Mid   Full                                                        +---------+---------------+---------+-----------+----------+--------------+ FV DistalFull                                                         +---------+---------------+---------+-----------+----------+--------------+ PFV      Full                                                        +---------+---------------+---------+-----------+----------+--------------+ POP      Full           Yes      Yes                                 +---------+---------------+---------+-----------+----------+--------------+ PTV      Full                    Yes                                 +---------+---------------+---------+-----------+----------+--------------+ PERO     Full                    Yes                                 +---------+---------------+---------+-----------+----------+--------------+     Summary: BILATERAL: - No evidence of deep vein thrombosis seen in the lower extremities, bilaterally. -No evidence of popliteal cyst, bilaterally.   *See table(s) above for measurements and observations. Electronically signed by Genny Kid MD on 10/30/2023 at 5:31:12 PM.    Final    ECHOCARDIOGRAM COMPLETE Result Date: 10/30/2023    ECHOCARDIOGRAM REPORT   Patient Name:   Erin Martin Date of Exam: 10/30/2023 Medical Rec #:  [id number]  Height:       61.0 in Accession #:    [id number]         Weight:       236.3 lb Date of Birth:  1966/04/20         BSA:          2.028 m Patient Age:    57 years           BP:           121/61 mmHg Patient Gender: F                  HR:           108 bpm. Exam Location:  Inpatient Procedure: 2D Echo, Cardiac Doppler and Color Doppler (Both Spectral and Color            Flow Doppler were utilized during procedure). Indications:    Pulmonary embolus  History:        Patient has no prior history of Echocardiogram examinations.                 Risk Factors:Hypertension.  Sonographer:    Juanita Shaw Referring Phys: [id number] MOHAMMAD L GARBA IMPRESSIONS  1. Left ventricular ejection fraction, by estimation, is 65 to 70%. The left ventricle has normal function. The left ventricle has no regional wall  motion abnormalities. Left ventricular diastolic parameters are consistent with Grade I diastolic dysfunction (impaired relaxation).  2. Right ventricular systolic function is normal. The right ventricular size is normal.  3. The mitral valve is normal in structure. No evidence of mitral valve regurgitation. No evidence of mitral stenosis.  4. The aortic valve is normal in structure. Aortic valve regurgitation is not visualized. No aortic stenosis is present.  5. The inferior vena cava is normal in size with greater than 50% respiratory variability, suggesting right atrial pressure of 3 mmHg. FINDINGS  Left Ventricle: Left ventricular ejection fraction, by estimation, is 65 to 70%. The left ventricle has normal function. The left ventricle has no regional wall motion abnormalities. The left ventricular internal cavity size was normal in size. There is  no left ventricular hypertrophy. Left ventricular diastolic parameters are consistent with Grade I diastolic dysfunction (impaired relaxation). Right Ventricle: The right ventricular size is normal. No increase in right ventricular wall thickness. Right ventricular systolic function is normal. Left Atrium: Left atrial size was normal in size. Right Atrium: Right atrial size was normal in size. Pericardium: There is no evidence of pericardial effusion. Mitral Valve: The mitral valve is normal in structure. No evidence of mitral valve regurgitation. No evidence of mitral valve stenosis. MV peak gradient, 2.3 mmHg. The mean mitral valve gradient is 1.0 mmHg. Tricuspid Valve: The tricuspid valve is normal in structure. Tricuspid valve regurgitation is not demonstrated. No evidence of tricuspid stenosis. Aortic Valve: The aortic valve is normal in structure. Aortic valve regurgitation is not visualized. No aortic stenosis is present. Aortic valve mean gradient measures 3.0 mmHg. Aortic valve peak gradient measures 7.0 mmHg. Aortic valve area, by VTI measures 2.39 cm.  Pulmonic Valve: The pulmonic valve was normal in structure. Pulmonic valve regurgitation is not visualized. No evidence of pulmonic stenosis. Aorta: The aortic root is normal in size and structure. Venous: The inferior vena cava is normal in size with greater than 50% respiratory variability, suggesting right atrial pressure of 3 mmHg. IAS/Shunts: No atrial level shunt detected by color flow Doppler.  LEFT VENTRICLE PLAX 2D LVIDd:  4.00 cm     Diastology LVIDs:         2.70 cm     LV e' medial:    9.68 cm/s LV PW:         0.90 cm     LV E/e' medial:  4.9 LV IVS:        0.80 cm     LV e' lateral:   10.70 cm/s LVOT diam:     1.90 cm     LV E/e' lateral: 4.4 LV SV:         34 LV SV Index:   17 LVOT Area:     2.84 cm  LV Volumes (MOD) LV vol d, MOD A2C: 89.6 ml LV vol d, MOD A4C: 79.1 ml LV vol s, MOD A2C: 31.9 ml LV vol s, MOD A4C: 28.5 ml LV SV MOD A2C:     57.7 ml LV SV MOD A4C:     79.1 ml LV SV MOD BP:      55.9 ml RIGHT VENTRICLE RV Basal diam:  3.20 cm RV Mid diam:    3.00 cm RV S prime:     14.80 cm/s TAPSE (M-mode): 1.7 cm LEFT ATRIUM             Index        RIGHT ATRIUM          Index LA diam:        3.40 cm 1.68 cm/m   RA Area:     8.20 cm LA Vol (A2C):   16.9 ml 8.33 ml/m   RA Volume:   16.40 ml 8.09 ml/m LA Vol (A4C):   28.7 ml 14.15 ml/m LA Biplane Vol: 22.5 ml 11.10 ml/m  AORTIC VALVE                    PULMONIC VALVE AV Area (Vmax):    2.01 cm     PV Vmax:       0.86 m/s AV Area (Vmean):   1.97 cm     PV Peak grad:  3.0 mmHg AV Area (VTI):     2.39 cm AV Vmax:           132.00 cm/s AV Vmean:          76.600 cm/s AV VTI:            0.141 m AV Peak Grad:      7.0 mmHg AV Mean Grad:      3.0 mmHg LVOT Vmax:         93.80 cm/s LVOT Vmean:        53.200 cm/s LVOT VTI:          0.119 m LVOT/AV VTI ratio: 0.84  AORTA Ao Root diam: 2.70 cm Ao Asc diam:  3.20 cm MITRAL VALVE MV Area (PHT): 6.48 cm    SHUNTS MV Area VTI:   2.84 cm    Systemic VTI:  0.12 m MV Peak grad:  2.3 mmHg    Systemic  Diam: 1.90 cm MV Mean grad:  1.0 mmHg MV Vmax:       0.76 m/s MV Vmean:      45.8 cm/s MV Decel Time: 117 msec MV E velocity: 47.20 cm/s MV A velocity: 62.80 cm/s MV E/A ratio:  0.75 Dorothye Gathers MD Electronically signed by Dorothye Gathers MD Signature Date/Time: 10/30/2023/2:52:15 PM    Final    IR US  Guide Vasc Access Right Result Date: 10/30/2023 INDICATION:  57 year old female with history of acute, intermediate-high risk pulmonary embolism. EXAM: 1. Ultrasound-guided vascular access of the right common femoral vein. 2. Selective catheterization and angiography of the main and bilateral pulmonary arteries. 3. Bilateral pulmonary aspiration thrombectomy. 4. Pulmonary manometry. COMPARISON:  CTA chest from 10/28/2023 MEDICATIONS: 3,000 units heparin, intravenous ANESTHESIA/SEDATION: Moderate (conscious) sedation was employed during this procedure. A total of Versed 4 mg and Fentanyl 200 mcg was administered intravenously. Moderate Sedation Time: 65 minutes. The patient's level of consciousness and vital signs were monitored continuously by radiology nursing throughout the procedure under my direct supervision. FLUOROSCOPY TIME:  One hundred seventy-one mGy COMPLICATIONS: None immediate. TECHNIQUE: Informed written consent was obtained from the patient after a thorough discussion of the procedural risks, benefits and alternatives. All questions were addressed. Maximal Sterile Barrier Technique was utilized including caps, mask, sterile gowns, sterile gloves, sterile drape, hand hygiene and skin antiseptic. A timeout was performed prior to the initiation of the procedure. Preprocedure ultrasound evaluation demonstrated patency of the right common femoral vein. The procedure was planned. Subdermal Local anesthesia was administered 1% lidocaine. A small skin nick was made. Under direct ultrasound visualization, the right common femoral vein was accessed with a 21 gauge micropuncture needle. A permanent ultrasound image was  captured and stored in the record. Micropuncture sheath was inserted followed by placement of a Bentson wire was directed to the inferior vena cava under fluoroscopic guidance. Serial dilation was performed with an 8 [place] vascular sheath followed by placement of 2 ProGlides in a pre close fashion at the 10 o'clock and 2 o'clock positions. Over the wire, a 24 [place] Inari sheath was then placed and directed to the inferior vena cava. Over the wire, a double angle pigtail catheter was inserted and under fluoroscopic guidance directed through the right atrium and right ventricle to the main pulmonary artery. A Rosen wire was then advanced to the right inferior lobar pulmonary artery. The pigtail catheter was exchanged for a 5 [place] select catheter which was directed into the right inferior lobar pulmonary artery. The wire was exchanged for a short taper superstiff Amplatz wire. The catheter was removed. A 24 [place] Flowtreiver aspiration catheter was then directed under fluoroscopic guidance to the main pulmonary artery. The inner dilator was removed. Pulmonary manometry was performed which was significant for a mean pulmonary artery pressure of 34 mm Hg. The aspiration catheter was then advanced to the right pulmonary artery. Multiple aspirations were performed which yielded a moderate volume of acute appearing thrombus. Right pulmonary angiogram was then repeated which demonstrated significantly improved patency and perfusion of the right lung. The coaxial system was then directed to the left pulmonary artery in the wire was inserted into the left inferior lobar pulmonary artery. Aspiration thrombectomy was then performed which yielded scant thrombus. Therefore, the T20 curved aspiration catheter was advanced in coaxial fashion and aspiration was performed which yielded large volume acute appearing thrombus. Repeat pulmonary manometry was then performed which demonstrated reduction in mean pulmonary artery  pressure to 23 mm Hg. At this point, the patient's tachycardia improved and oxygen saturations improved. Left pulmonary angiogram demonstrated significantly improved perfusion throughout the left lung. The catheters were removed. The sheath was then removed and the ProGlides were tied and cut. IMPRESSION: 1. Successful bilateral pulmonary artery thrombectomy with removal of large volume acute appearing thrombus. 2. Pulmonary manometry demonstrated reduction in mean pulmonary artery pressure from 34 mm Hg to 23 mm Hg. Creasie Doctor, MD [facility]  Electronically Signed   By: Creasie Doctor M.D.   On: 10/30/2023 08:32   IR Angiogram Pulmonary Bilateral Selective Result Date: 10/30/2023 INDICATION: 58 year old female with history of acute, intermediate-high risk pulmonary embolism. EXAM: 1. Ultrasound-guided vascular access of the right common femoral vein. 2. Selective catheterization and angiography of the main and bilateral pulmonary arteries. 3. Bilateral pulmonary aspiration thrombectomy. 4. Pulmonary manometry. COMPARISON:  CTA chest from 10/28/2023 MEDICATIONS: 3,000 units heparin, intravenous ANESTHESIA/SEDATION: Moderate (conscious) sedation was employed during this procedure. A total of Versed 4 mg and Fentanyl 200 mcg was administered intravenously. Moderate Sedation Time: 65 minutes. The patient's level of consciousness and vital signs were monitored continuously by radiology nursing throughout the procedure under my direct supervision. FLUOROSCOPY TIME:  One hundred seventy-one mGy COMPLICATIONS: None immediate. TECHNIQUE: Informed written consent was obtained from the patient after a thorough discussion of the procedural risks, benefits and alternatives. All questions were addressed. Maximal Sterile Barrier Technique was utilized including caps, mask, sterile gowns, sterile gloves, sterile drape, hand hygiene and skin antiseptic. A timeout was  performed prior to the initiation of the procedure. Preprocedure ultrasound evaluation demonstrated patency of the right common femoral vein. The procedure was planned. Subdermal Local anesthesia was administered 1% lidocaine. A small skin nick was made. Under direct ultrasound visualization, the right common femoral vein was accessed with a 21 gauge micropuncture needle. A permanent ultrasound image was captured and stored in the record. Micropuncture sheath was inserted followed by placement of a Bentson wire was directed to the inferior vena cava under fluoroscopic guidance. Serial dilation was performed with an 8 [place] vascular sheath followed by placement of 2 ProGlides in a pre close fashion at the 10 o'clock and 2 o'clock positions. Over the wire, a 24 [place] Inari sheath was then placed and directed to the inferior vena cava. Over the wire, a double angle pigtail catheter was inserted and under fluoroscopic guidance directed through the right atrium and right ventricle to the main pulmonary artery. A Rosen wire was then advanced to the right inferior lobar pulmonary artery. The pigtail catheter was exchanged for a 5 [place] select catheter which was directed into the right inferior lobar pulmonary artery. The wire was exchanged for a short taper superstiff Amplatz wire. The catheter was removed. A 24 [place] Flowtreiver aspiration catheter was then directed under fluoroscopic guidance to the main pulmonary artery. The inner dilator was removed. Pulmonary manometry was performed which was significant for a mean pulmonary artery pressure of 34 mm Hg. The aspiration catheter was then advanced to the right pulmonary artery. Multiple aspirations were performed which yielded a moderate volume of acute appearing thrombus. Right pulmonary angiogram was then repeated which demonstrated significantly improved patency and perfusion of the right lung. The coaxial system was then directed to the left pulmonary artery in  the wire was inserted into the left inferior lobar pulmonary artery. Aspiration thrombectomy was then performed which yielded scant thrombus. Therefore, the T20 curved aspiration catheter was advanced in coaxial fashion and aspiration was performed which yielded large volume acute appearing thrombus. Repeat pulmonary manometry was then performed which demonstrated reduction in mean pulmonary artery pressure to 23 mm Hg. At this point, the patient's tachycardia improved and oxygen saturations improved. Left pulmonary angiogram demonstrated significantly improved perfusion throughout the left lung. The catheters were removed. The sheath was then removed and the ProGlides were tied and cut. IMPRESSION: 1. Successful bilateral pulmonary artery thrombectomy with removal of large volume acute appearing thrombus. 2. Pulmonary manometry  demonstrated reduction in mean pulmonary artery pressure from 34 mm Hg to 23 mm Hg. Creasie Doctor, MD [facility] Radiology Electronically Signed   By: Creasie Doctor M.D.   On: 10/30/2023 08:32   IR US  Guide Vasc Access Right Result Date: 10/30/2023 INDICATION: 58 year old female with history of acute, intermediate-high risk pulmonary embolism. EXAM: 1. Ultrasound-guided vascular access of the right common femoral vein. 2. Selective catheterization and angiography of the main and bilateral pulmonary arteries. 3. Bilateral pulmonary aspiration thrombectomy. 4. Pulmonary manometry. COMPARISON:  CTA chest from 10/28/2023 MEDICATIONS: 3,000 units heparin, intravenous ANESTHESIA/SEDATION: Moderate (conscious) sedation was employed during this procedure. A total of Versed 4 mg and Fentanyl 200 mcg was administered intravenously. Moderate Sedation Time: 65 minutes. The patient's level of consciousness and vital signs were monitored continuously by radiology nursing throughout the procedure under my direct supervision. FLUOROSCOPY TIME:  One hundred  seventy-one mGy COMPLICATIONS: None immediate. TECHNIQUE: Informed written consent was obtained from the patient after a thorough discussion of the procedural risks, benefits and alternatives. All questions were addressed. Maximal Sterile Barrier Technique was utilized including caps, mask, sterile gowns, sterile gloves, sterile drape, hand hygiene and skin antiseptic. A timeout was performed prior to the initiation of the procedure. Preprocedure ultrasound evaluation demonstrated patency of the right common femoral vein. The procedure was planned. Subdermal Local anesthesia was administered 1% lidocaine. A small skin nick was made. Under direct ultrasound visualization, the right common femoral vein was accessed with a 21 gauge micropuncture needle. A permanent ultrasound image was captured and stored in the record. Micropuncture sheath was inserted followed by placement of a Bentson wire was directed to the inferior vena cava under fluoroscopic guidance. Serial dilation was performed with an 8 [place] vascular sheath followed by placement of 2 ProGlides in a pre close fashion at the 10 o'clock and 2 o'clock positions. Over the wire, a 24 [place] Inari sheath was then placed and directed to the inferior vena cava. Over the wire, a double angle pigtail catheter was inserted and under fluoroscopic guidance directed through the right atrium and right ventricle to the main pulmonary artery. A Rosen wire was then advanced to the right inferior lobar pulmonary artery. The pigtail catheter was exchanged for a 5 [place] select catheter which was directed into the right inferior lobar pulmonary artery. The wire was exchanged for a short taper superstiff Amplatz wire. The catheter was removed. A 24 [place] Flowtreiver aspiration catheter was then directed under fluoroscopic guidance to the main pulmonary artery. The inner dilator was removed. Pulmonary manometry was performed which was significant for a mean pulmonary artery  pressure of 34 mm Hg. The aspiration catheter was then advanced to the right pulmonary artery. Multiple aspirations were performed which yielded a moderate volume of acute appearing thrombus. Right pulmonary angiogram was then repeated which demonstrated significantly improved patency and perfusion of the right lung. The coaxial system was then directed to the left pulmonary artery in the wire was inserted into the left inferior lobar pulmonary artery. Aspiration thrombectomy was then performed which yielded scant thrombus. Therefore, the T20 curved aspiration catheter was advanced in coaxial fashion and aspiration was performed which yielded large volume acute appearing thrombus. Repeat pulmonary manometry was then performed which demonstrated reduction in mean pulmonary artery pressure to 23 mm Hg. At this point, the patient's tachycardia improved and oxygen saturations improved. Left pulmonary angiogram demonstrated significantly improved perfusion throughout the left lung. The catheters were removed. The sheath was then  removed and the ProGlides were tied and cut. IMPRESSION: 1. Successful bilateral pulmonary artery thrombectomy with removal of large volume acute appearing thrombus. 2. Pulmonary manometry demonstrated reduction in mean pulmonary artery pressure from 34 mm Hg to 23 mm Hg. Creasie Doctor, MD [facility] Radiology Electronically Signed   By: Creasie Doctor M.D.   On: 10/30/2023 08:32   IR THROMBECT PRIM MECH INIT (INCLU) MOD SED Result Date: 10/30/2023 INDICATION: 58 year old female with history of acute, intermediate-high risk pulmonary embolism. EXAM: 1. Ultrasound-guided vascular access of the right common femoral vein. 2. Selective catheterization and angiography of the main and bilateral pulmonary arteries. 3. Bilateral pulmonary aspiration thrombectomy. 4. Pulmonary manometry. COMPARISON:  CTA chest from 10/28/2023 MEDICATIONS: 3,000 units  heparin, intravenous ANESTHESIA/SEDATION: Moderate (conscious) sedation was employed during this procedure. A total of Versed 4 mg and Fentanyl 200 mcg was administered intravenously. Moderate Sedation Time: 65 minutes. The patient's level of consciousness and vital signs were monitored continuously by radiology nursing throughout the procedure under my direct supervision. FLUOROSCOPY TIME:  One hundred seventy-one mGy COMPLICATIONS: None immediate. TECHNIQUE: Informed written consent was obtained from the patient after a thorough discussion of the procedural risks, benefits and alternatives. All questions were addressed. Maximal Sterile Barrier Technique was utilized including caps, mask, sterile gowns, sterile gloves, sterile drape, hand hygiene and skin antiseptic. A timeout was performed prior to the initiation of the procedure. Preprocedure ultrasound evaluation demonstrated patency of the right common femoral vein. The procedure was planned. Subdermal Local anesthesia was administered 1% lidocaine. A small skin nick was made. Under direct ultrasound visualization, the right common femoral vein was accessed with a 21 gauge micropuncture needle. A permanent ultrasound image was captured and stored in the record. Micropuncture sheath was inserted followed by placement of a Bentson wire was directed to the inferior vena cava under fluoroscopic guidance. Serial dilation was performed with an 8 [place] vascular sheath followed by placement of 2 ProGlides in a pre close fashion at the 10 o'clock and 2 o'clock positions. Over the wire, a 24 [place] Inari sheath was then placed and directed to the inferior vena cava. Over the wire, a double angle pigtail catheter was inserted and under fluoroscopic guidance directed through the right atrium and right ventricle to the main pulmonary artery. A Rosen wire was then advanced to the right inferior lobar pulmonary artery. The pigtail catheter was exchanged for a 5 [place] select  catheter which was directed into the right inferior lobar pulmonary artery. The wire was exchanged for a short taper superstiff Amplatz wire. The catheter was removed. A 24 [place] Flowtreiver aspiration catheter was then directed under fluoroscopic guidance to the main pulmonary artery. The inner dilator was removed. Pulmonary manometry was performed which was significant for a mean pulmonary artery pressure of 34 mm Hg. The aspiration catheter was then advanced to the right pulmonary artery. Multiple aspirations were performed which yielded a moderate volume of acute appearing thrombus. Right pulmonary angiogram was then repeated which demonstrated significantly improved patency and perfusion of the right lung. The coaxial system was then directed to the left pulmonary artery in the wire was inserted into the left inferior lobar pulmonary artery. Aspiration thrombectomy was then performed which yielded scant thrombus. Therefore, the T20 curved aspiration catheter was advanced in coaxial fashion and aspiration was performed which yielded large volume acute appearing thrombus. Repeat pulmonary manometry was then performed which demonstrated reduction in mean pulmonary artery pressure to 23 mm Hg. At this  point, the patient's tachycardia improved and oxygen saturations improved. Left pulmonary angiogram demonstrated significantly improved perfusion throughout the left lung. The catheters were removed. The sheath was then removed and the ProGlides were tied and cut. IMPRESSION: 1. Successful bilateral pulmonary artery thrombectomy with removal of large volume acute appearing thrombus. 2. Pulmonary manometry demonstrated reduction in mean pulmonary artery pressure from 34 mm Hg to 23 mm Hg. Creasie Doctor, MD [facility] Radiology Electronically Signed   By: Creasie Doctor M.D.   On: 10/30/2023 08:32   IR THROMBECT PRIM MECH ADD (INCLU) MOD SED Result Date:  10/30/2023 INDICATION: 58 year old female with history of acute, intermediate-high risk pulmonary embolism. EXAM: 1. Ultrasound-guided vascular access of the right common femoral vein. 2. Selective catheterization and angiography of the main and bilateral pulmonary arteries. 3. Bilateral pulmonary aspiration thrombectomy. 4. Pulmonary manometry. COMPARISON:  CTA chest from 10/28/2023 MEDICATIONS: 3,000 units heparin, intravenous ANESTHESIA/SEDATION: Moderate (conscious) sedation was employed during this procedure. A total of Versed 4 mg and Fentanyl 200 mcg was administered intravenously. Moderate Sedation Time: 65 minutes. The patient's level of consciousness and vital signs were monitored continuously by radiology nursing throughout the procedure under my direct supervision. FLUOROSCOPY TIME:  One hundred seventy-one mGy COMPLICATIONS: None immediate. TECHNIQUE: Informed written consent was obtained from the patient after a thorough discussion of the procedural risks, benefits and alternatives. All questions were addressed. Maximal Sterile Barrier Technique was utilized including caps, mask, sterile gowns, sterile gloves, sterile drape, hand hygiene and skin antiseptic. A timeout was performed prior to the initiation of the procedure. Preprocedure ultrasound evaluation demonstrated patency of the right common femoral vein. The procedure was planned. Subdermal Local anesthesia was administered 1% lidocaine. A small skin nick was made. Under direct ultrasound visualization, the right common femoral vein was accessed with a 21 gauge micropuncture needle. A permanent ultrasound image was captured and stored in the record. Micropuncture sheath was inserted followed by placement of a Bentson wire was directed to the inferior vena cava under fluoroscopic guidance. Serial dilation was performed with an 8 [place] vascular sheath followed by placement of 2 ProGlides in a pre close fashion at the 10 o'clock and 2 o'clock  positions. Over the wire, a 24 [place] Inari sheath was then placed and directed to the inferior vena cava. Over the wire, a double angle pigtail catheter was inserted and under fluoroscopic guidance directed through the right atrium and right ventricle to the main pulmonary artery. A Rosen wire was then advanced to the right inferior lobar pulmonary artery. The pigtail catheter was exchanged for a 5 [place] select catheter which was directed into the right inferior lobar pulmonary artery. The wire was exchanged for a short taper superstiff Amplatz wire. The catheter was removed. A 24 [place] Flowtreiver aspiration catheter was then directed under fluoroscopic guidance to the main pulmonary artery. The inner dilator was removed. Pulmonary manometry was performed which was significant for a mean pulmonary artery pressure of 34 mm Hg. The aspiration catheter was then advanced to the right pulmonary artery. Multiple aspirations were performed which yielded a moderate volume of acute appearing thrombus. Right pulmonary angiogram was then repeated which demonstrated significantly improved patency and perfusion of the right lung. The coaxial system was then directed to the left pulmonary artery in the wire was inserted into the left inferior lobar pulmonary artery. Aspiration thrombectomy was then performed which yielded scant thrombus. Therefore, the T20 curved aspiration catheter was advanced in coaxial fashion and aspiration was  performed which yielded large volume acute appearing thrombus. Repeat pulmonary manometry was then performed which demonstrated reduction in mean pulmonary artery pressure to 23 mm Hg. At this point, the patient's tachycardia improved and oxygen saturations improved. Left pulmonary angiogram demonstrated significantly improved perfusion throughout the left lung. The catheters were removed. The sheath was then removed and the ProGlides were tied and cut. IMPRESSION: 1. Successful bilateral  pulmonary artery thrombectomy with removal of large volume acute appearing thrombus. 2. Pulmonary manometry demonstrated reduction in mean pulmonary artery pressure from 34 mm Hg to 23 mm Hg. Creasie Doctor, MD [facility] Radiology Electronically Signed   By: Creasie Doctor M.D.   On: 10/30/2023 08:32   CT Angio Chest PE W and/or Wo Contrast Result Date: 10/28/2023 CLINICAL DATA:  Concern for pulmonary embolism. EXAM: CT ANGIOGRAPHY CHEST WITH CONTRAST TECHNIQUE: Multidetector CT imaging of the chest was performed using the standard protocol during bolus administration of intravenous contrast. Multiplanar CT image reconstructions and MIPs were obtained to evaluate the vascular anatomy. RADIATION DOSE REDUCTION: This exam was performed according to the departmental dose-optimization program which includes automated exposure control, adjustment of the mA and/or kV according to patient size and/or use of iterative reconstruction technique. CONTRAST:  75mL OMNIPAQUE IOHEXOL 350 MG/ML SOLN COMPARISON:  Chest radiograph dated 10/28/2023. FINDINGS: Cardiovascular: There is no cardiomegaly or pericardial effusion. The thoracic aorta is unremarkable. The origins of the great vessels of the aortic arch appear patent. Large bilateral central pulmonary artery emboli extending into the lobar branches of the upper and lower lobes. There is a right heart straining with RV/LV ratio of approximately 2. Mediastinum/Nodes: No hilar or mediastinal adenopathy. The esophagus is grossly unremarkable. No mediastinal fluid collection. Lungs/Pleura: Several peripheral nodular densities primarily involving the lung bases measure up to 7 mm in the left lung base. There is a tiny amount of air within a pulmonary nodule at the right lung base (72/13). Findings may represent septic emboli or atypical infection such as fungal infection versus areas of pulmonary infarct. No pleural effusion or  pneumothorax. The central airways are patent. Upper Abdomen: No acute abnormality. Musculoskeletal: No acute osseous pathology. Review of the MIP images confirms the above findings. IMPRESSION: 1. Positive for acute PE with CT evidence of right heart strain (RV/LV Ratio = 2.0) consistent with at least submassive (intermediate risk) PE. The presence of right heart strain has been associated with an increased risk of morbidity and mortality. Please refer to the "Code PE Focused" order set in EPIC. 2. Several peripheral nodular densities primarily involving the lung bases may represent septic emboli or fungal infection versus areas of pulmonary infarct. These results were called by telephone at the time of interpretation on 10/28/2023 at 5:47 pm to provider Paris Bolds , who verbally acknowledged these results. Electronically Signed   By: Angus Bark M.D.   On: 10/28/2023 17:50   DG Chest 2 View Result Date: 10/28/2023 CLINICAL DATA:  Chest pain. EXAM: CHEST - 2 VIEW COMPARISON:  None Available. FINDINGS: No focal consolidation, pleural effusion or pneumothorax. The cardiac silhouette is within limits. No acute osseous pathology. IMPRESSION: No active cardiopulmonary disease. Electronically Signed   By: Angus Bark M.D.   On: 10/28/2023 16:13    Microbiology: No results found for this or any previous visit.  Labs: CBC: Recent Labs  Lab 10/28/23 1516 10/29/23 0144 10/29/23 0714 10/30/23 0153 10/31/23 0520  WBC 5.9 6.8 7.1 8.9 6.6  HGB 13.8 12.4 12.9 11.3*  9.5*  HCT 43.5 38.5 40.9 36.0 31.1*  MCV 91.2 90.8 91.9 92.3 94.8  PLT 206 179 189 179 142*   Basic Metabolic Panel: Recent Labs  Lab 10/28/23 1516 10/29/23 0714 10/30/23 0857  NA 141 140 133*  K 3.1* 2.8* 3.2*  CL 108 104 102  CO2 21* 25 23  GLUCOSE 107* 113* 108*  BUN 13 13 12   CREATININE 0.79 0.80 0.76  CALCIUM 9.0 8.9 8.1*   Liver Function Tests: Recent Labs  Lab 10/29/23 0714  AST 19  ALT 12  ALKPHOS 53   BILITOT 1.3*  PROT 7.5  ALBUMIN 3.4*   CBG: No results for input(s): "GLUCAP" in the last 168 hours.  Discharge time spent: greater than 30 minutes.  Signed: Lorita Rosa, MD [facility] 10/31/2023

## 2023-11-04 DIAGNOSIS — Z419 Encounter for procedure for purposes other than remedying health state, unspecified: Secondary | ICD-10-CM | POA: Diagnosis not present

## 2023-12-05 DIAGNOSIS — Z419 Encounter for procedure for purposes other than remedying health state, unspecified: Secondary | ICD-10-CM | POA: Diagnosis not present

## 2023-12-18 ENCOUNTER — Other Ambulatory Visit: Payer: Self-pay

## 2024-01-04 DIAGNOSIS — Z419 Encounter for procedure for purposes other than remedying health state, unspecified: Secondary | ICD-10-CM | POA: Diagnosis not present

## 2024-01-16 ENCOUNTER — Other Ambulatory Visit (HOSPITAL_COMMUNITY): Payer: Self-pay

## 2024-01-31 ENCOUNTER — Other Ambulatory Visit (HOSPITAL_COMMUNITY): Payer: Self-pay

## 2024-02-04 DIAGNOSIS — Z419 Encounter for procedure for purposes other than remedying health state, unspecified: Secondary | ICD-10-CM | POA: Diagnosis not present

## 2024-03-06 DIAGNOSIS — Z419 Encounter for procedure for purposes other than remedying health state, unspecified: Secondary | ICD-10-CM | POA: Diagnosis not present

## 2024-04-05 DIAGNOSIS — Z419 Encounter for procedure for purposes other than remedying health state, unspecified: Secondary | ICD-10-CM | POA: Diagnosis not present
